# Patient Record
Sex: Female | Born: 1955 | Race: White | Hispanic: No | Smoking: Never smoker
Health system: Southern US, Community
[De-identification: ages and names within clinical notes are randomized; demographics above are authoritative.]

## PROBLEM LIST (undated history)

## (undated) ENCOUNTER — Emergency Department (HOSPITAL_COMMUNITY): Admission: EM | Payer: Self-pay | Source: Home / Self Care

---

## 2019-06-18 ENCOUNTER — Inpatient Hospital Stay (HOSPITAL_COMMUNITY)
Admission: AD | Admit: 2019-06-18 | Discharge: 2019-06-21 | DRG: 522 | Disposition: A | Discharge status: Home / Self Care | Payer: Medicare Other | Source: Other Acute Inpatient Hospital | Attending: Orthopaedic Surgery | Admitting: Orthopaedic Surgery

## 2019-06-18 ENCOUNTER — Emergency Department (HOSPITAL_COMMUNITY)
Admission: EM | Admit: 2019-06-18 | Discharge: 2019-06-18 | Discharge status: Absent without leave | Payer: Medicare Other | Source: Home / Self Care

## 2019-06-18 ENCOUNTER — Inpatient Hospital Stay (HOSPITAL_COMMUNITY)
Admission: EM | Admit: 2019-06-18 | Discharge: 2019-06-18 | Disposition: A | Discharge status: Still In Hospital | Payer: Medicare Other | Source: Other Acute Inpatient Hospital | Attending: Specialist | Admitting: Specialist

## 2019-06-18 ENCOUNTER — Emergency Department (HOSPITAL_COMMUNITY)
Admission: EM | Admit: 2019-06-18 | Discharge: 2019-06-18 | Discharge status: Absent without leave | Payer: Medicare Other | Source: Other Acute Inpatient Hospital

## 2019-06-18 DIAGNOSIS — Z419 Encounter for procedure for purposes other than remedying health state, unspecified: Secondary | ICD-10-CM

## 2019-06-18 DIAGNOSIS — Z96641 Presence of right artificial hip joint: Secondary | ICD-10-CM

## 2019-06-18 DIAGNOSIS — Z20828 Contact with and (suspected) exposure to other viral communicable diseases: Secondary | ICD-10-CM | POA: Diagnosis not present

## 2019-06-18 DIAGNOSIS — Z881 Allergy status to other antibiotic agents status: Secondary | ICD-10-CM | POA: Diagnosis not present

## 2019-06-18 DIAGNOSIS — W010XXA Fall on same level from slipping, tripping and stumbling without subsequent striking against object, initial encounter: Secondary | ICD-10-CM | POA: Diagnosis present

## 2019-06-18 DIAGNOSIS — Y93H2 Activity, gardening and landscaping: Secondary | ICD-10-CM

## 2019-06-18 DIAGNOSIS — S72041A Displaced fracture of base of neck of right femur, initial encounter for closed fracture: Secondary | ICD-10-CM | POA: Diagnosis not present

## 2019-06-18 DIAGNOSIS — Z79899 Other long term (current) drug therapy: Secondary | ICD-10-CM

## 2019-06-18 DIAGNOSIS — Y92017 Garden or yard in single-family (private) house as the place of occurrence of the external cause: Secondary | ICD-10-CM

## 2019-06-18 DIAGNOSIS — S72001A Fracture of unspecified part of neck of right femur, initial encounter for closed fracture: Principal | ICD-10-CM | POA: Diagnosis present

## 2019-06-18 DIAGNOSIS — Z886 Allergy status to analgesic agent status: Secondary | ICD-10-CM | POA: Diagnosis not present

## 2019-06-18 DIAGNOSIS — M25551 Pain in right hip: Secondary | ICD-10-CM | POA: Diagnosis present

## 2019-06-18 MED ORDER — HYDROCODONE-ACETAMINOPHEN 5-325 MG PO TABS
1.0000 | ORAL_TABLET | ORAL | Status: DC | PRN
  Administered 2019-06-18: 1 via ORAL
  Filled 2019-06-18: qty 1

## 2019-06-18 MED ORDER — DOCUSATE SODIUM 100 MG PO CAPS
100.0000 mg | ORAL_CAPSULE | Freq: Two times a day (BID) | ORAL | Status: DC
  Administered 2019-06-19 – 2019-06-21 (×4): 100 mg via ORAL
  Filled 2019-06-18 (×4): qty 1

## 2019-06-18 MED ORDER — HYDROCODONE-ACETAMINOPHEN 7.5-325 MG PO TABS: 1.0000 | ORAL_TABLET | ORAL | Status: DC | PRN

## 2019-06-18 MED ORDER — DIPHENHYDRAMINE HCL 12.5 MG/5ML PO ELIX: 12.5000 mg | ORAL_SOLUTION | ORAL | Status: DC | PRN

## 2019-06-18 MED ORDER — SODIUM CHLORIDE 0.9 % IV SOLN
INTRAVENOUS | Status: DC
  Administered 2019-06-19: 06:00:00 via INTRAVENOUS

## 2019-06-18 MED ORDER — MORPHINE SULFATE (PF) 2 MG/ML IV SOLN: 0.5000 mg | INTRAVENOUS | Status: DC | PRN

## 2019-06-18 MED ORDER — METHOCARBAMOL 1000 MG/10ML IJ SOLN
500.0000 mg | Freq: Four times a day (QID) | INTRAVENOUS | Status: DC | PRN
  Filled 2019-06-18: qty 5

## 2019-06-18 MED ORDER — METHOCARBAMOL 500 MG PO TABS
500.0000 mg | ORAL_TABLET | Freq: Four times a day (QID) | ORAL | Status: DC | PRN
  Administered 2019-06-19 – 2019-06-21 (×5): 500 mg via ORAL
  Filled 2019-06-18 (×5): qty 1

## 2019-06-18 MED ORDER — ACETAMINOPHEN 325 MG PO TABS
325.0000 mg | ORAL_TABLET | Freq: Four times a day (QID) | ORAL | Status: DC | PRN
  Administered 2019-06-19: 325 mg via ORAL
  Administered 2019-06-20 – 2019-06-21 (×4): 650 mg via ORAL
  Filled 2019-06-18 (×5): qty 2

## 2019-06-18 NOTE — H&P (Signed)
[facility] NOTE REDACTION - DIFFERENT PATIENT  Due to an Incorrect Registration or Identity Theft, we are redacting this note. In these particular instances; both names are NOT the same patient.  We have changed the demographic information to reflect those of the correct patient.  Please review for accuracy and ? Mark Complt the note.  Please do not refresh the body of the note.   Should the change in demographics impact clinical decisions that had been made at the time; You may choose to Edit Note from our Note Redaction.  This will create an Addendum      Deborah Kelly On[employer] Identity Application Analyst [phone] [email or web address] 

## 2019-06-18 NOTE — H&P (Signed)
Haley Marshall is an 63 y.o. female.   Chief Complaint: Right hip pain with known hip fracture HPI: The patient is a healthy 63 year old female from [place] who was down in [place] visiting her daughter.  Yesterday in the late afternoon she was doing yard work with her daughter when she fell landing directly on her right hip.  She had severe right hip pain and the inability to ambulate.  She was seen and admitted to an outside hospital with displaced right hip femoral neck fracture.  She reports only right hip pain.  She did request transfer to a closer hospital given the need for surgery and the recommendation for a total hip arthroplasty.  She has no previous hip pain and does not walk with assistive device.  She is a very active individual and does wish to proceed with a total hip arthroplasty to treat this injury.  She has no acute medical issues.  She does take Plaquenil to treat lupus.  She is not a diabetic and not a smoker.  She had a COVID-19 rapid test done this morning at the outlying hospital which was negative for coronavirus.  She reports only right hip pain and denies any other injuries.  No past medical history on file.  There is no past surgical history.  No family history on file. Social History:  has no history on file for tobacco, alcohol, and drug.  Allergies:  Allergies  Allergen Reactions  . Tetracyclines & Related Nausea And Vomiting  . Celebrex [Celecoxib] Rash    Medications Prior to Admission  Medication Sig Dispense Refill  . Cholecalciferol (VITAMIN D3) 125 MCG (5000 UT) CAPS Take 1 capsule by mouth daily.    . hydroxychloroquine (PLAQUENIL) 200 MG tablet Take 200 mg by mouth daily.    Marland Kitchen OVER THE COUNTER MEDICATION Take 1 capsule by mouth daily. Vitamin C Complex      No results found for this or any previous visit (from the past 48 hour(s)). No results found.  Review of Systems  Musculoskeletal: Positive for joint pain.  All  other systems reviewed and are negative.   Blood pressure 118/60, pulse 82, temperature 98.3 F (36.8 C), temperature source Oral, resp. rate 17, height 5\' 9"  (1.753 m), weight 68.9 kg, SpO2 97 %. Physical Exam  Constitutional: She is oriented to person, place, and time. She appears well-developed and well-nourished.  HENT:  Head: Normocephalic and atraumatic.  Eyes: Pupils are equal, round, and reactive to light. EOM are normal.  Neck: Normal range of motion. Neck supple.  Cardiovascular: Normal rate and regular rhythm.  Respiratory: Effort normal and breath sounds normal.  GI: Soft. Bowel sounds are normal.  Musculoskeletal:     Right hip: She exhibits decreased range of motion, decreased strength, tenderness, bony tenderness and deformity.  Neurological: She is alert and oriented to person, place, and time.  Skin: Skin is warm and dry.  Psychiatric: She has a normal mood and affect.    Her right leg is shortened and externally rotated consistent with a right hip fracture.  Her foot is well-perfused with normal sensation.  She is able to move her foot and toes.  Assessment/Plan Displaced right hip femoral neck fracture  She understands that our recommendation is a total hip arthroplasty.  She is a young, active, and healthy female with displaced femoral neck fracture.  She understands our recommendation fully for surgery.  I discussed in detail what the surgery involves.  We talked about her  interoperative and postoperative course.  I discussed in detail the risk and benefits of surgery as well.  All question concerns were answered and addressed.  Our plan will be to proceed to surgery late tomorrow afternoon (Monday) for a right total hip arthroplasty through direct anterior approach.  Kathryne Hitch, MD 06/18/2019, 11:06 PM

## 2019-06-19 ENCOUNTER — Encounter: Admission: EM | Payer: Self-pay | Source: Other Acute Inpatient Hospital

## 2019-06-19 ENCOUNTER — Inpatient Hospital Stay (HOSPITAL_COMMUNITY): Payer: Medicare Other | Admitting: Certified Registered"

## 2019-06-19 ENCOUNTER — Encounter (HOSPITAL_COMMUNITY)
Admission: AD | Disposition: A | Discharge status: Home / Self Care | Payer: Self-pay | Source: Other Acute Inpatient Hospital | Attending: Orthopaedic Surgery

## 2019-06-19 ENCOUNTER — Inpatient Hospital Stay (HOSPITAL_COMMUNITY): Payer: Medicare Other

## 2019-06-19 ENCOUNTER — Other Ambulatory Visit: Payer: Self-pay

## 2019-06-19 ENCOUNTER — Inpatient Hospital Stay
Admission: EM | Admit: 2019-06-19 | Payer: Self-pay | Source: Other Acute Inpatient Hospital | Admitting: Orthopaedic Surgery

## 2019-06-19 ENCOUNTER — Encounter (HOSPITAL_COMMUNITY): Payer: Self-pay | Admitting: *Deleted

## 2019-06-19 DIAGNOSIS — S72001A Fracture of unspecified part of neck of right femur, initial encounter for closed fracture: Secondary | ICD-10-CM | POA: Diagnosis not present

## 2019-06-19 DIAGNOSIS — S72041A Displaced fracture of base of neck of right femur, initial encounter for closed fracture: Secondary | ICD-10-CM

## 2019-06-19 HISTORY — PX: TOTAL HIP ARTHROPLASTY: SHX124

## 2019-06-19 LAB — TYPE AND SCREEN
ABO/RH(D): O POS
Antibody Screen: NEGATIVE

## 2019-06-19 LAB — BASIC METABOLIC PANEL
Anion gap: 10 (ref 5–15)
BUN: 20 mg/dL (ref 8–23)
CO2: 23 mmol/L (ref 22–32)
Calcium: 8.5 mg/dL — ABNORMAL LOW (ref 8.9–10.3)
Chloride: 108 mmol/L (ref 98–111)
Creatinine, Ser: 0.6 mg/dL (ref 0.44–1.00)
GFR calc Af Amer: >60 mL/min (ref >60)
GFR calc non Af Amer: >60 mL/min (ref >60)
Glucose, Bld: 114 mg/dL — ABNORMAL HIGH (ref 70–99)
Potassium: 3.8 mmol/L (ref 3.5–5.1)
Sodium: 141 mmol/L (ref 135–145)

## 2019-06-19 LAB — CBC
HCT: 38.6 % (ref 36.0–46.0)
Hemoglobin: 12.5 g/dL (ref 12.0–15.0)
MCH: 30 pg (ref 26.0–34.0)
MCHC: 32.4 g/dL (ref 30.0–36.0)
MCV: 92.6 fL (ref 80.0–100.0)
Platelets: 165 10*3/uL (ref 150–400)
RBC: 4.17 MIL/uL (ref 3.87–5.11)
RDW: 12.7 % (ref 11.5–15.5)
WBC: 4.7 10*3/uL (ref 4.0–10.5)
nRBC: 0 % (ref 0.0–0.2)

## 2019-06-19 LAB — MRSA PCR SCREENING: MRSA by PCR: NEGATIVE

## 2019-06-19 LAB — SARS CORONAVIRUS 2 BY RT PCR (HOSPITAL ORDER, PERFORMED IN ~~LOC~~ HOSPITAL LAB): SARS Coronavirus 2: POSITIVE — AB

## 2019-06-19 LAB — PROTIME-INR
INR: 1.1 (ref 0.8–1.2)
Prothrombin Time: 14 seconds (ref 11.4–15.2)

## 2019-06-19 LAB — ABO/RH: ABO/RH(D): O POS

## 2019-06-19 LAB — SARS CORONAVIRUS 2 (TAT 6-24 HRS): SARS Coronavirus 2: NEGATIVE

## 2019-06-19 SURGERY — ARTHROPLASTY, HIP, TOTAL, ANTERIOR APPROACH
Anesthesia: General | Site: Hip | Laterality: Right

## 2019-06-19 SURGERY — ARTHROPLASTY, HIP, TOTAL, ANTERIOR APPROACH
Anesthesia: Monitor Anesthesia Care | Site: Hip | Laterality: Right

## 2019-06-19 MED ORDER — FENTANYL CITRATE (PF) 250 MCG/5ML IJ SOLN
INTRAMUSCULAR | Status: AC
  Filled 2019-06-19: qty 5

## 2019-06-19 MED ORDER — ACETAMINOPHEN 325 MG PO TABS: 325.0000 mg | ORAL_TABLET | Freq: Four times a day (QID) | ORAL | Status: DC | PRN

## 2019-06-19 MED ORDER — ONDANSETRON HCL 4 MG/2ML IJ SOLN: 4.0000 mg | Freq: Four times a day (QID) | INTRAMUSCULAR | Status: DC | PRN

## 2019-06-19 MED ORDER — OXYCODONE HCL 5 MG PO TABS: 5.0000 mg | ORAL_TABLET | Freq: Once | ORAL | Status: DC | PRN

## 2019-06-19 MED ORDER — FENTANYL CITRATE (PF) 100 MCG/2ML IJ SOLN
INTRAMUSCULAR | Status: AC
  Filled 2019-06-19: qty 2

## 2019-06-19 MED ORDER — ASPIRIN 81 MG PO CHEW
81.0000 mg | CHEWABLE_TABLET | Freq: Two times a day (BID) | ORAL | Status: DC
  Administered 2019-06-20 – 2019-06-21 (×3): 81 mg via ORAL
  Filled 2019-06-19 (×3): qty 1

## 2019-06-19 MED ORDER — OXYCODONE HCL 5 MG PO TABS: 5.0000 mg | ORAL_TABLET | ORAL | Status: DC | PRN

## 2019-06-19 MED ORDER — DOCUSATE SODIUM 100 MG PO CAPS: 100.0000 mg | ORAL_CAPSULE | Freq: Two times a day (BID) | ORAL | Status: DC

## 2019-06-19 MED ORDER — METHOCARBAMOL 500 MG PO TABS: 500.0000 mg | ORAL_TABLET | Freq: Four times a day (QID) | ORAL | Status: DC | PRN

## 2019-06-19 MED ORDER — HYDROMORPHONE HCL 1 MG/ML IJ SOLN: 0.5000 mg | INTRAMUSCULAR | Status: DC | PRN

## 2019-06-19 MED ORDER — HYDROCODONE-ACETAMINOPHEN 5-325 MG PO TABS: 1.0000 | ORAL_TABLET | ORAL | Status: DC | PRN

## 2019-06-19 MED ORDER — ONDANSETRON HCL 4 MG/2ML IJ SOLN
INTRAMUSCULAR | Status: DC | PRN
  Administered 2019-06-19: 4 mg via INTRAVENOUS

## 2019-06-19 MED ORDER — PANTOPRAZOLE SODIUM 40 MG PO TBEC
40.0000 mg | DELAYED_RELEASE_TABLET | Freq: Every day | ORAL | Status: DC
  Administered 2019-06-20 – 2019-06-21 (×2): 40 mg via ORAL
  Filled 2019-06-19 (×3): qty 1

## 2019-06-19 MED ORDER — MENTHOL 3 MG MT LOZG: 1.0000 | LOZENGE | OROMUCOSAL | Status: DC | PRN

## 2019-06-19 MED ORDER — PROPOFOL 10 MG/ML IV BOLUS
INTRAVENOUS | Status: DC | PRN
  Administered 2019-06-19: 30 mg via INTRAVENOUS
  Administered 2019-06-19: 20 mg via INTRAVENOUS

## 2019-06-19 MED ORDER — METHOCARBAMOL 1000 MG/10ML IJ SOLN: 500.0000 mg | Freq: Four times a day (QID) | INTRAVENOUS | Status: DC | PRN

## 2019-06-19 MED ORDER — METOCLOPRAMIDE HCL 10 MG PO TABS: 5.0000 mg | ORAL_TABLET | Freq: Three times a day (TID) | ORAL | Status: DC | PRN

## 2019-06-19 MED ORDER — MIDAZOLAM HCL 5 MG/5ML IJ SOLN
INTRAMUSCULAR | Status: DC | PRN
  Administered 2019-06-19: 2 mg via INTRAVENOUS

## 2019-06-19 MED ORDER — ONDANSETRON HCL 4 MG PO TABS: 4.0000 mg | ORAL_TABLET | Freq: Four times a day (QID) | ORAL | Status: DC | PRN

## 2019-06-19 MED ORDER — FENTANYL CITRATE (PF) 100 MCG/2ML IJ SOLN: 25.0000 ug | INTRAMUSCULAR | Status: DC | PRN

## 2019-06-19 MED ORDER — HYDROCODONE-ACETAMINOPHEN 7.5-325 MG PO TABS
1.0000 | ORAL_TABLET | ORAL | Status: DC | PRN
  Administered 2019-06-19: 2 via ORAL
  Filled 2019-06-19: qty 2

## 2019-06-19 MED ORDER — 0.9 % SODIUM CHLORIDE (POUR BTL) OPTIME
TOPICAL | Status: DC | PRN
  Administered 2019-06-19: 1000 mL

## 2019-06-19 MED ORDER — OXYCODONE HCL 5 MG PO TABS
ORAL_TABLET | ORAL | Status: AC
  Filled 2019-06-19: qty 1

## 2019-06-19 MED ORDER — VITAMIN D 25 MCG (1000 UNIT) PO TABS
5000.0000 [IU] | ORAL_TABLET | Freq: Every day | ORAL | Status: DC
  Administered 2019-06-20 – 2019-06-21 (×2): 5000 [IU] via ORAL
  Filled 2019-06-19 (×2): qty 5

## 2019-06-19 MED ORDER — OXYCODONE HCL 5 MG PO TABS: 10.0000 mg | ORAL_TABLET | ORAL | Status: DC | PRN

## 2019-06-19 MED ORDER — FENTANYL CITRATE (PF) 100 MCG/2ML IJ SOLN
INTRAMUSCULAR | Status: DC | PRN
  Administered 2019-06-19: 100 ug via INTRAVENOUS

## 2019-06-19 MED ORDER — ONDANSETRON HCL 4 MG/2ML IJ SOLN
INTRAMUSCULAR | Status: AC
  Filled 2019-06-19: qty 2

## 2019-06-19 MED ORDER — CHLORHEXIDINE GLUCONATE CLOTH 2 % EX PADS
6.0000 | MEDICATED_PAD | Freq: Every day | CUTANEOUS | Status: DC
  Administered 2019-06-19: 6 via TOPICAL

## 2019-06-19 MED ORDER — PHENOL 1.4 % MT LIQD: 1.0000 | OROMUCOSAL | Status: DC | PRN

## 2019-06-19 MED ORDER — DIPHENHYDRAMINE HCL 12.5 MG/5ML PO ELIX: 12.5000 mg | ORAL_SOLUTION | ORAL | Status: DC | PRN

## 2019-06-19 MED ORDER — SODIUM CHLORIDE 0.9 % IV SOLN
INTRAVENOUS | Status: DC
  Administered 2019-06-19: 23:00:00 via INTRAVENOUS

## 2019-06-19 MED ORDER — METOCLOPRAMIDE HCL 5 MG/ML IJ SOLN: 5.0000 mg | Freq: Three times a day (TID) | INTRAMUSCULAR | Status: DC | PRN

## 2019-06-19 MED ORDER — CEFAZOLIN SODIUM-DEXTROSE 2-3 GM-%(50ML) IV SOLR
INTRAVENOUS | Status: DC | PRN
  Administered 2019-06-19: 2 g via INTRAVENOUS

## 2019-06-19 MED ORDER — OXYCODONE HCL 5 MG/5ML PO SOLN: 5.0000 mg | Freq: Once | ORAL | Status: DC | PRN

## 2019-06-19 MED ORDER — ALUM & MAG HYDROXIDE-SIMETH 200-200-20 MG/5ML PO SUSP: 30.0000 mL | ORAL | Status: DC | PRN

## 2019-06-19 MED ORDER — PHENYLEPHRINE HCL-NACL 10-0.9 MG/250ML-% IV SOLN
INTRAVENOUS | Status: DC | PRN
  Administered 2019-06-19: 40 ug/min via INTRAVENOUS

## 2019-06-19 MED ORDER — ONDANSETRON HCL 4 MG/2ML IJ SOLN: 4.0000 mg | Freq: Once | INTRAMUSCULAR | Status: DC | PRN

## 2019-06-19 MED ORDER — CEFAZOLIN SODIUM-DEXTROSE 1-4 GM/50ML-% IV SOLN
1.0000 g | Freq: Four times a day (QID) | INTRAVENOUS | Status: AC
  Administered 2019-06-19 – 2019-06-20 (×2): 1 g via INTRAVENOUS
  Filled 2019-06-19 (×2): qty 50

## 2019-06-19 MED ORDER — DEXAMETHASONE SODIUM PHOSPHATE 10 MG/ML IJ SOLN
INTRAMUSCULAR | Status: DC | PRN
  Administered 2019-06-19: 10 mg via INTRAVENOUS

## 2019-06-19 MED ORDER — HYDROXYCHLOROQUINE SULFATE 200 MG PO TABS
200.0000 mg | ORAL_TABLET | Freq: Every day | ORAL | Status: DC
  Administered 2019-06-20 – 2019-06-21 (×2): 200 mg via ORAL
  Filled 2019-06-19 (×2): qty 1

## 2019-06-19 MED ORDER — PROPOFOL 500 MG/50ML IV EMUL
INTRAVENOUS | Status: DC | PRN
  Administered 2019-06-19: 100 ug/kg/min via INTRAVENOUS

## 2019-06-19 MED ORDER — LACTATED RINGERS IV SOLN
INTRAVENOUS | Status: DC | PRN
  Administered 2019-06-19: 18:00:00 via INTRAVENOUS

## 2019-06-19 MED ORDER — MIDAZOLAM HCL 2 MG/2ML IJ SOLN
INTRAMUSCULAR | Status: AC
  Filled 2019-06-19: qty 2

## 2019-06-19 MED ORDER — POLYETHYLENE GLYCOL 3350 17 G PO PACK: 17.0000 g | PACK | Freq: Every day | ORAL | Status: DC | PRN

## 2019-06-19 SURGICAL SUPPLY — 57 items
BENZOIN TINCTURE PRP APPL 2/3 (GAUZE/BANDAGES/DRESSINGS) ×3 IMPLANT
BLADE CLIPPER SURG (BLADE) IMPLANT
BLADE SAW SGTL 18X1.27X75 (BLADE) ×4 IMPLANT
BLADE SAW SGTL 18X1.27X75MM (BLADE) ×2
CLOSURE STERI-STRIP 1/2X4 (GAUZE/BANDAGES/DRESSINGS) ×1
CLOSURE WOUND 1/2 X4 (GAUZE/BANDAGES/DRESSINGS) ×2
CLSR STERI-STRIP ANTIMIC 1/2X4 (GAUZE/BANDAGES/DRESSINGS) ×2 IMPLANT
COVER SURGICAL LIGHT HANDLE (MISCELLANEOUS) ×3 IMPLANT
COVER WAND RF STERILE (DRAPES) ×3 IMPLANT
CUP SECTOR GRIPTON 50MM (Cup) ×3 IMPLANT
DRAPE C-ARM 42X72 X-RAY (DRAPES) ×3 IMPLANT
DRAPE STERI IOBAN 125X83 (DRAPES) ×3 IMPLANT
DRAPE U-SHAPE 47X51 STRL (DRAPES) ×9 IMPLANT
DRSG AQUACEL AG ADV 3.5X10 (GAUZE/BANDAGES/DRESSINGS) ×3 IMPLANT
DURAPREP 26ML APPLICATOR (WOUND CARE) ×3 IMPLANT
ELECT BLADE 4.0 EZ CLEAN MEGAD (MISCELLANEOUS) ×3
ELECT BLADE 6.5 EXT (BLADE) IMPLANT
ELECT REM PT RETURN 9FT ADLT (ELECTROSURGICAL) ×3
ELECTRODE BLDE 4.0 EZ CLN MEGD (MISCELLANEOUS) ×1 IMPLANT
ELECTRODE REM PT RTRN 9FT ADLT (ELECTROSURGICAL) ×1 IMPLANT
FACESHIELD WRAPAROUND (MASK) ×6 IMPLANT
GLOVE BIOGEL PI IND STRL 8 (GLOVE) ×2 IMPLANT
GLOVE BIOGEL PI INDICATOR 8 (GLOVE) ×4
GLOVE ECLIPSE 8.0 STRL XLNG CF (GLOVE) ×3 IMPLANT
GLOVE ORTHO TXT STRL SZ7.5 (GLOVE) ×6 IMPLANT
GOWN STRL REUS W/ TWL LRG LVL3 (GOWN DISPOSABLE) ×2 IMPLANT
GOWN STRL REUS W/ TWL XL LVL3 (GOWN DISPOSABLE) ×2 IMPLANT
GOWN STRL REUS W/TWL LRG LVL3 (GOWN DISPOSABLE) ×4
GOWN STRL REUS W/TWL XL LVL3 (GOWN DISPOSABLE) ×4
HANDPIECE INTERPULSE COAX TIP (DISPOSABLE) ×2
HEAD FEMORAL 32 CERAMIC (Hips) ×3 IMPLANT
KIT BASIN OR (CUSTOM PROCEDURE TRAY) ×3 IMPLANT
KIT TURNOVER KIT B (KITS) ×3 IMPLANT
LINER ACETABULAR 32X50 (Liner) ×3 IMPLANT
MANIFOLD NEPTUNE II (INSTRUMENTS) ×3 IMPLANT
NS IRRIG 1000ML POUR BTL (IV SOLUTION) ×3 IMPLANT
PACK TOTAL JOINT (CUSTOM PROCEDURE TRAY) ×3 IMPLANT
PAD ARMBOARD 7.5X6 YLW CONV (MISCELLANEOUS) ×3 IMPLANT
SCREW 6.5MMX25MM (Screw) ×3 IMPLANT
SET HNDPC FAN SPRY TIP SCT (DISPOSABLE) ×1 IMPLANT
STAPLER VISISTAT 35W (STAPLE) IMPLANT
STEM FEM ACTIS STD SZ4 (Stem) ×3 IMPLANT
STRIP CLOSURE SKIN 1/2X4 (GAUZE/BANDAGES/DRESSINGS) ×4 IMPLANT
SUT ETHIBOND NAB CT1 #1 30IN (SUTURE) ×6 IMPLANT
SUT MNCRL AB 4-0 PS2 18 (SUTURE) IMPLANT
SUT VIC AB 0 CT1 27 (SUTURE) ×2
SUT VIC AB 0 CT1 27XBRD ANBCTR (SUTURE) ×1 IMPLANT
SUT VIC AB 1 CT1 27 (SUTURE) ×4
SUT VIC AB 1 CT1 27XBRD ANBCTR (SUTURE) ×2 IMPLANT
SUT VIC AB 2-0 CT1 27 (SUTURE) ×4
SUT VIC AB 2-0 CT1 TAPERPNT 27 (SUTURE) ×2 IMPLANT
TOWEL GREEN STERILE (TOWEL DISPOSABLE) ×3 IMPLANT
TOWEL GREEN STERILE FF (TOWEL DISPOSABLE) ×3 IMPLANT
TRAY CATH 16FR W/PLASTIC CATH (SET/KITS/TRAYS/PACK) IMPLANT
TRAY FOLEY W/BAG SLVR 16FR (SET/KITS/TRAYS/PACK)
TRAY FOLEY W/BAG SLVR 16FR ST (SET/KITS/TRAYS/PACK) IMPLANT
WATER STERILE IRR 1000ML POUR (IV SOLUTION) ×6 IMPLANT

## 2019-06-19 NOTE — Op Note (Signed)
NAME: Haley Marshall, Haley Marshall MEDICAL RECORD CB:[id number] ACCOUNT [id number] DATE OF BIRTH:[birth year] FACILITY: [facility] LOCATION: [facility] PHYSICIAN:Shantasia Hunnell [place], MD  OPERATIVE REPORT  DATE OF PROCEDURE:  06/19/2019  PREOPERATIVE DIAGNOSIS:  Right hip displaced femoral neck fracture.  POSTOPERATIVE DIAGNOSIS:  Right hip displaced femoral neck fracture.  PROCEDURE:  Right total hip arthroplasty through direct anterior approach.  IMPLANTS:  DePuy Sector Gription acetabular component size 50, with a single screw, size 32+0 neutral polyethylene liner, size 4 Actis femoral component with standard offset, size 32+1 ceramic hip ball.  SURGEON:  Jonn Shingles, MD  ASSISTANT:  Erskine Emery, PA-C  ANESTHESIA:  Spinal.  ANTIBIOTICS:  Two g IV Ancef.  ESTIMATED BLOOD LOSS:  100 mL.  COMPLICATIONS:  None.  INDICATIONS:  The patient is a very pleasant 63 year old healthy female who was in her daughter's yard near [place], [place] when they were doing some yard work and she sustained an accidental mechanical fall, landing on her right hip.  She had  the inability to ambulate and severe right hip pain afterwards.  She had no history of hip problems before this.  She was seen in an outlying hospital and admitted with a femoral neck fracture.  She then requested transfer to our health system given the  fact that she lives in [place] and she wanted to have definitive treatment and followup here.  Also, the surgeon on call down in [place] does not do a lot of hip surgery and recommended a partial hip replacement.  We felt that a total hip  replacement would be better given the fact that she is only 63 years old.  We told her about anterior hip surgery.  She was then transferred to [facility] uneventfully.  She has been asymptomatic other than right hip pain.  She had no chest pain,  shortness of breath, fever, chills, nausea, vomiting.  A rapid COVID-19 test was performed at  the outside hospital, which was negative.  We still repeated the test here and it came back positive, so she was then transferred to the COVID 4 here at [facility].  I requested a second rapid test and so she had that done today and that came back negative, but she still had to be treated for presumptive positive even though she is asymptomatic.  That certainly does not make sense given the fact that she has  now had 2 negative COVID tests sandwiched on either side of a positive test and she is asymptomatic, but with that being said, we are still going to have to treat her as COVID positive for infection prevention.  She understands fully well.  We  recommended total hip arthroplasty to treat her displaced right hip femoral neck fracture.  I had a long and thorough discussion about the risk of acute blood loss anemia, nerve or vessel injury, fracture, infection, dislocation, DVT and implant failure.   We talked about our goals being decreased pain, improve mobility and overall improve quality of life.  DESCRIPTION OF PROCEDURE:  After informed consent was obtained, the appropriate right hip was marked.  She was brought to the operating room and laid on her side on the stretcher where spinal anesthesia was obtained.  A Foley catheter had already been  placed.  We placed traction boots on both her feet and placed her supine on the Hana fracture table, with the peroneal post in place and both legs in line skeletal traction device and no traction applied.  Her right operative hip  was prepped and draped  with DuraPrep and sterile drapes.  We assessed it radiographically as well.  A timeout was called to identify correct patient and correct right hip.  We then made an incision just inferior and posterior to the anterior superior iliac spine and carried  this obliquely down the leg.  We dissected down to tensor fascia lata muscle.  Tensor fascia was then divided longitudinally to proceed with direct anterior  approach to the hip.  We identified and cauterized circumflex vessels.  We then identified the  hip capsule, opened up the hip capsule in an L-type format, finding a large hemarthrosis consistent with a femoral neck fracture.  You could see the femoral neck was completely broken through and the femoral head was displaced.  We then placed Cobra  retractors around the medial and lateral femoral neck and made our femoral neck cut just distal to the fracture and proximal to the lesser trochanter.  We did this with an oscillating saw and completed it with an osteotome.  We placed a corkscrew guide  in the femoral head and removed the femoral head in its entirety and found no disease with the hip in terms of arthritis.  We then placed a bent Hohmann over the medial acetabular rim and removed remnants of the acetabular labrum and other debris from  the hip.  I then began reaming under direct visualization from a size 44 reamer in stepwise increments up to a size 49, with all reamers placed under direct visualization, the last reamer under direct fluoroscopy so we could obtain our depth of reaming,  our inclination and anteversion.  We then placed the real DePuy Sector Gription acetabular component size 32 and a single screw.  I placed a 32+0 neutral polyethylene liner for that size acetabular component.  Attention was then turned to the femur.   With the leg externally rotated to 120 degrees, extended and adducted, we were able to place a Mueller retractor medially and Hohman retractor behind the greater trochanter.  We did release the lateral joint capsule and used a box-cutting osteotome to  enter femoral canal with a rongeur to lateralize.  We then began broaching using the Actis broaching system from a size zero, going up to a size 4.  With a size 4 in place, we trialed a standard offset femoral neck and a 32+1 trial hip ball, reduced this  in the acetabulum.  We were pleased with range of motion, leg length,  offset and stability assessed radiographically and clinically.  We then dislocated the hip and removed the trial components.  We placed the real Actis femoral component size 4 with  standard offset and the real 32+1 ceramic hip ball, again reduced this in the acetabulum and we were pleased with stability.  We then irrigated the soft tissue with normal saline solution using pulsatile lavage.  I assessed the hip again radiographically  just to make sure we were pleased with the  component position.  We then closed the joint capsule with interrupted #1 Ethibond suture.  The tensor fascia was closed with #1 Vicryl suture, 0 Vicryl was used to close the deep tissue, 2-0 Vicryl was used  to close the subcutaneous tissue and a 4-0 Monocryl subcuticular stitch was applied.  Steri-Strips were placed on the skin.  An Aquacel dressing was applied.  She was taken to the recovery room in stable condition.  All final counts were correct.  There  were no complications noted.  Of note,  Rexene EdisonGil Clark, PA-C, assisted in the entire case.  His assistance was crucial for facilitating all aspects of this case.  VN/NUANCE  D:06/19/2019 T:06/19/2019 JOB:[id number]

## 2019-06-19 NOTE — Progress Notes (Addendum)
Discussed patient's case with infection prevention. Discussed that patient had a negative covid test at an outside facility, followed by a positive test on [facility]. Patient was re-tested this morning and test came back negative. Per IP, continue isolation precautions and continue with precautions in OR this afternoon.

## 2019-06-19 NOTE — Progress Notes (Signed)
Patient ID: Haley Marshall, female   DOB: Dec 29, 1955, 63 y.o.   MRN: [id number] The patient is scheduled today for a right total hip arthroplasty to treat her acute femoral neck fracture of the right hip.  Surgery will be this evening.  Her injury occurred at 4 PM Saturday late afternoon while working in the yard with her daughter.  She landed directly on that hip.  She has had no cough, chest pain, shortness of breath, fever, chills, nausea, vomiting.  She was admitted to a hospital in [facility] and then transferred here to [facility] yesterday.  A rapid Covid test done yesterday morning at the outlying hospital was negative.  I did order another test at our hospital yesterday evening just in case that test was not allowed.  It actually came back positive.  She was transferred to [address].  She continues to be asymptomatic with stable vital signs.  Our plan will still be to proceed to surgery this evening for her hip replacement.  Hopefully this can be done under spinal anesthesia.  She does wish to have the surgery done and it is medically warranted given now she is 48 hours into a broken hip.  Given the fact that she had a negative COVID-19 test yesterday followed by later a positive test, we will repeat that test 1 more time today.  Regardless, our plan will be to proceed to surgery tonight.  We will likely need to treat her as Covid positive and perform surgery under the protocols for patients that are positive.

## 2019-06-19 NOTE — Anesthesia Preprocedure Evaluation (Signed)
Anesthesia Evaluation  Patient identified by MRN, date of birth, ID band Patient awake    Reviewed: Allergy & Precautions, NPO status , Patient's Chart, lab work & pertinent test results  Airway Mallampati: II  TM Distance: >3 FB Neck ROM: Full    Dental  (+) Teeth Intact, Dental Advisory Given   Pulmonary    breath sounds clear to auscultation       Cardiovascular  Rhythm:Regular Rate:Normal     Neuro/Psych    GI/Hepatic   Endo/Other    Renal/GU      Musculoskeletal   Abdominal   Peds  Hematology   Anesthesia Other Findings   Reproductive/Obstetrics                             Anesthesia Physical Anesthesia Plan  ASA: III  Anesthesia Plan: Spinal and MAC   Post-op Pain Management:    Induction:   PONV Risk Score and Plan: Ondansetron and Dexamethasone  Airway Management Planned: Natural Airway and Simple Face Mask  Additional Equipment:   Intra-op Plan:   Post-operative Plan:   Informed Consent: I have reviewed the patients History and Physical, chart, labs and discussed the procedure including the risks, benefits and alternatives for the proposed anesthesia with the patient or authorized representative who has indicated his/her understanding and acceptance.     Dental advisory given  Plan Discussed with: CRNA and Anesthesiologist  Anesthesia Plan Comments:         Anesthesia Quick Evaluation

## 2019-06-19 NOTE — Progress Notes (Signed)
Patient transferred to [id number] with belongings.

## 2019-06-19 NOTE — Progress Notes (Signed)
Patient ID: Haley Marshall, female   DOB: 1956/02/13, 63 y.o.   MRN: [id number] The patient tolerated her right total hip arthroplasty surgery well.  Again this was to treat an acute displaced right hip femoral neck fracture.  My main concern is that she is on [facility] [address] on the COVID-19 floor.  I understand the rationale by putting her there first.  However I am uncertain as to the validity of her COVID-19 test.  She did have one of the highly specific COVID-19 test at the outside hospital that came back negative yesterday morning.  My concern was that our hospital would not honor that test because I have seen that happen before when we have tried to take someone to the operating room and the operating room's concern is that the test was not done here even though it was just completed yesterday.  I then ordered a rapid Covid test done here yesterday evening and this came back positive.  The patient has been completely asymptomatic.  Without being said I ordered another rapid COVID-19 test today and that came back negative.  According to infection prevention, she still needs to be potentially treated as Covid positive in light of a positive test sandwiched between 2 valid negative tests.  That is my concern.  Hopefully we will be able to still get the appropriate care for this patient from orthopedic standpoint on the [address] floor in terms of the therapy needs and for getting her discharged safely to home in the next 1 to 2 days.

## 2019-06-19 NOTE — Progress Notes (Signed)
Pt was transported to OR.  Debbi is alert and oriented.

## 2019-06-19 NOTE — Plan of Care (Signed)
  Problem: Coping: Goal: Level of anxiety will decrease Outcome: Progressing   Problem: Pain Managment: Goal: General experience of comfort will improve Outcome: Progressing   Problem: Safety: Goal: Ability to remain free from injury will improve Outcome: Progressing   Problem: Skin Integrity: Goal: Risk for impaired skin integrity will decrease Outcome: Progressing   

## 2019-06-19 NOTE — Progress Notes (Signed)
Informed Dr. Lorin Mercy of patient positive result of CoVid swab test by RT PCR. Obtained an order to transfer patient to [facility].

## 2019-06-19 NOTE — Transfer of Care (Signed)
Immediate Anesthesia Transfer of Care Note  Patient: Haley Marshall  Procedure(s) Performed: TOTAL HIP ARTHROPLASTY ANTERIOR APPROACH (Right Hip)  Patient Location: PACU and PACU RN to OR  Anesthesia Type:MAC and Spinal  Level of Consciousness: awake, alert  and oriented  Airway & Oxygen Therapy: Patient Spontanous Breathing and Patient connected to face mask oxygen  Post-op Assessment: Report given to RN and Post -op Vital signs reviewed and stable  Post vital signs: Reviewed and stable  Last Vitals:  Vitals Value Taken Time  BP    Temp    Pulse    Resp    SpO2      Last Pain:  Vitals:   06/19/19 1200  TempSrc: Oral  PainSc:          Complications: No apparent anesthesia complications

## 2019-06-19 NOTE — Anesthesia Procedure Notes (Signed)
Spinal  Patient location during procedure: OR Start time: 06/19/2019 5:35 PM End time: 06/19/2019 5:40 PM Staffing Anesthesiologist: Roberts Gaudy, MD Performed: anesthesiologist  Preanesthetic Checklist Completed: patient identified, site marked, surgical consent, pre-op evaluation, timeout performed, IV checked, risks and benefits discussed and monitors and equipment checked Spinal Block Patient position: right lateral decubitus Prep: DuraPrep Patient monitoring: heart rate, cardiac monitor, continuous pulse ox and blood pressure Approach: midline Location: L3-4 Injection technique: single-shot Needle Needle type: Sprotte and Tuohy  Needle gauge: 22 G Needle length: 9 cm Assessment Sensory level: T4 Additional Notes 1.6 cc 0.75% Bupivacaine injected

## 2019-06-19 NOTE — Brief Op Note (Signed)
06/19/2019  7:06 PM  PATIENT:  Haley Marshall  63 y.o. female  PRE-OPERATIVE DIAGNOSIS:  RIGHT HIP FRACTURE  POST-OPERATIVE DIAGNOSIS:  RIGHT HIP FRACTURE  PROCEDURE:  Procedure(s): TOTAL HIP ARTHROPLASTY ANTERIOR APPROACH (Right)  SURGEON:  Surgeon(s) and Role:    Mcarthur Rossetti, MD - Primary  PHYSICIAN ASSISTANT: Benita Stabile, PA-C  ANESTHESIA:   spinal  EBL: 100 cc  COUNTS:  YES  DICTATION: .Other Dictation: Dictation Number [id number]  PLAN OF CARE: Admit to inpatient   PATIENT DISPOSITION:  PACU - hemodynamically stable.   Delay start of Pharmacological VTE agent (>24hrs) due to surgical blood loss or risk of bleeding: no

## 2019-06-20 ENCOUNTER — Encounter (HOSPITAL_COMMUNITY): Payer: Self-pay | Admitting: Orthopaedic Surgery

## 2019-06-20 LAB — CBC
HCT: 34.3 % — ABNORMAL LOW (ref 36.0–46.0)
Hemoglobin: 11 g/dL — ABNORMAL LOW (ref 12.0–15.0)
MCH: 30.1 pg (ref 26.0–34.0)
MCHC: 32.1 g/dL (ref 30.0–36.0)
MCV: 93.7 fL (ref 80.0–100.0)
Platelets: 144 10*3/uL — ABNORMAL LOW (ref 150–400)
RBC: 3.66 MIL/uL — ABNORMAL LOW (ref 3.87–5.11)
RDW: 12.6 % (ref 11.5–15.5)
WBC: 4.9 10*3/uL (ref 4.0–10.5)
nRBC: 0 % (ref 0.0–0.2)

## 2019-06-20 LAB — BASIC METABOLIC PANEL
Anion gap: 12 (ref 5–15)
BUN: 16 mg/dL (ref 8–23)
CO2: 22 mmol/L (ref 22–32)
Calcium: 8.4 mg/dL — ABNORMAL LOW (ref 8.9–10.3)
Chloride: 106 mmol/L (ref 98–111)
Creatinine, Ser: 0.51 mg/dL (ref 0.44–1.00)
GFR calc Af Amer: >60 mL/min (ref >60)
GFR calc non Af Amer: >60 mL/min (ref >60)
Glucose, Bld: 111 mg/dL — ABNORMAL HIGH (ref 70–99)
Potassium: 4.1 mmol/L (ref 3.5–5.1)
Sodium: 140 mmol/L (ref 135–145)

## 2019-06-20 NOTE — Evaluation (Signed)
Occupational Therapy Evaluation Patient Details Name: Haley Marshall MRN: [id number] DOB: [birth year] Today's Date: 06/20/2019    History of Present Illness 63 yo female no sig nificant PMH experienced mechanical fall in daughter's yard and found to have displaced R femoral neck fx 12/05. Transferred to [facility] for R direct anterior total hip replacement by Dr Magnus Ivan. s/p sx 12/07.    Clinical Impression   This 63 y/o female presents with the above. PTA pt reports independence with ADL and functional mobility. Pt presents supine in bed pleasant and ready to participate with therapies. Pt demonstrating room level functional mobility using RW with overall minA. She currently requires up to [facility] for LB ADL, minA for toileting and standing grooming ADL. Pt overall tolerating session well with biggest limitation being RLE deficits given recent sx. Pt reports plans to return home with 24hr assist from daughter initially for ADL/iADL PRN. Pt will benefit from continued acute OT services to maximize her safety and independence with ADL and mobility. Do not anticipate pt will require follow up OT services. Will follow.   Follow Up Recommendations  No OT follow up;Supervision/Assistance - 24 hour(24hr initially)    Equipment Recommendations  None recommended by OT(pt reports able to borrow necessary equipment)           Precautions / Restrictions Precautions Precautions: Fall Precaution Comments: direct R THA Restrictions Weight Bearing Restrictions: Yes RLE Weight Bearing: Weight bearing as tolerated      Mobility Bed Mobility Overal bed mobility: Needs Assistance Bed Mobility: Supine to Sit     Supine to sit: Min guard;HOB elevated     General bed mobility comments: increased time and cues for technique, use of bedrail; minguard for safety  Transfers Overall transfer level: Needs assistance Equipment used: Rolling walker (2 wheeled) Transfers: Sit to/from Stand Sit to Stand:  Min assist;Min guard         General transfer comment: initially min A from bed, min guard from toilet and subsequently from bed    Balance Overall balance assessment: Needs assistance Sitting-balance support: Feet supported Sitting balance-Leahy Scale: Good     Standing balance support: Bilateral upper extremity supported;No upper extremity supported;During functional activity;Single extremity supported Standing balance-Leahy Scale: Fair                             ADL either performed or assessed with clinical judgement   ADL Overall ADL's : Needs assistance/impaired Eating/Feeding: Modified independent;Sitting   Grooming: Wash/dry hands;Min guard;Minimal assistance;Standing   Upper Body Bathing: Set up;Sitting   Lower Body Bathing: Minimal assistance;Sit to/from stand   Upper Body Dressing : Set up;Sitting   Lower Body Dressing: Minimal assistance;Moderate assistance;Sit to/from stand Lower Body Dressing Details (indicate cue type and reason): requires assist to don R sock (pt attempted to perform on her own but not quite able to), minA for standing balance Toilet Transfer: Minimal assistance;Ambulation;RW;Grab bars   Toileting- Clothing Manipulation and Hygiene: Minimal assistance;Sit to/from stand;Sitting/lateral lean Toileting - Clothing Manipulation Details (indicate cue type and reason): pt performing pericare via lateral leans, some assist for gown management     Functional mobility during ADLs: Minimal assistance;Rolling walker                           Pertinent Vitals/Pain Pain Assessment: No/denies pain     Hand Dominance Right   Extremity/Trunk Assessment Upper Extremity Assessment Upper Extremity Assessment: Overall  WFL for tasks assessed   Lower Extremity Assessment Lower Extremity Assessment: Defer to PT evaluation RLE Deficits / Details: R hip and knee ROM limited by stiffness and swelling, strength grossly assessed from  mobility to be 4/5 RLE: Unable to fully assess due to pain RLE Sensation: decreased light touch(post nerve block "different" feeling ) RLE Coordination: decreased fine motor       Communication Communication Communication: No difficulties   Cognition Arousal/Alertness: Awake/alert Behavior During Therapy: WFL for tasks assessed/performed Overall Cognitive Status: Within Functional Limits for tasks assessed                                     General Comments  dressings intact with minor drainage, mild edema in R LE      Exercises Exercises: General Lower Extremity General Exercises - Lower Extremity Ankle Circles/Pumps: 20 reps;Left;Seated Long Arc Quad: AROM;Right;10 reps;Seated   Shoulder Instructions      Home Living Family/patient expects to be discharged to:: Private residence Living Arrangements: Alone Available Help at Discharge: Family;Available 24 hours/day Type of Home: House Home Access: Stairs to enter [employer] of Steps: 1   Home Layout: One level     Bathroom Shower/Tub: Occupational psychologist: Standard Bathroom Accessibility: Yes   Home Equipment: Environmental consultant - 2 wheels;Tub bench;Bedside commode          Prior Functioning/Environment Level of Independence: Independent                 OT Problem List: Decreased strength;Decreased range of motion;Decreased activity tolerance;Impaired balance (sitting and/or standing);Decreased knowledge of use of DME or AE;Decreased knowledge of precautions      OT Treatment/Interventions: Self-care/ADL training;Therapeutic exercise;Energy conservation;DME and/or AE instruction;Therapeutic activities;Patient/family education;Balance training    OT Goals(Current goals can be found in the care plan section) Acute Rehab OT Goals Patient Stated Goal: go home OT Goal Formulation: With patient Time For Goal Achievement: 07/04/19 Potential to Achieve Goals: Good  OT Frequency:  Min 2X/week   Barriers to D/C:            Co-evaluation PT/OT/SLP Co-Evaluation/Treatment: Yes Reason for Co-Treatment: For patient/therapist safety PT goals addressed during session: Mobility/safety with mobility;Proper use of DME OT goals addressed during session: ADL's and self-care      AM-PAC OT "6 Clicks" Daily Activity     Outcome Measure Help from another person eating meals?: None Help from another person taking care of personal grooming?: A Little Help from another person toileting, which includes using toliet, bedpan, or urinal?: A Little Help from another person bathing (including washing, rinsing, drying)?: A Little Help from another person to put on and taking off regular upper body clothing?: None Help from another person to put on and taking off regular lower body clothing?: A Little 6 Click Score: 20   End of Session Equipment Utilized During Treatment: Gait belt;Rolling walker Nurse Communication: Mobility status  Activity Tolerance: Patient tolerated treatment well Patient left: in chair;with call bell/phone within reach  OT Visit Diagnosis: Other abnormalities of gait and mobility (R26.89);Unsteadiness on feet (R26.81)                Time: 7619-5093 OT Time Calculation (min): 48 min Charges:  OT General Charges $OT Visit: 1 Visit OT Evaluation $OT Eval Moderate Complexity: [place], OT [employer] Pager [phone] Office [phone]   [name] 06/20/2019,  2:34 PM

## 2019-06-20 NOTE — Progress Notes (Signed)
Subjective: 1 Day Post-Op Procedure(s) (LRB): TOTAL HIP ARTHROPLASTY ANTERIOR APPROACH (Right) Patient reports pain as mild.  Tolerated surgery well.  No symptoms of covid 19.  Hgb stable post-op.  Vitals stable.  Objective: Vital signs in last 24 hours: Temp:  [97 F (36.1 C)-98.8 F (37.1 C)] 97.8 F (36.6 C) (12/08 0358) Pulse Rate:  [68-88] 68 (12/08 0407) Resp:  [12-22] 12 (12/08 0407) BP: (91-120)/(55-83) 107/58 (12/08 0358) SpO2:  [90 %-99 %] 95 % (12/08 0407) Weight:  [65.5 kg] 65.5 kg (12/08 0703)  Intake/Output from previous day: 12/07 0701 - 12/08 0700 In: 1571.2 [P.O.:280; I.V.:691.2; IV Piggyback:50] Out: 400 [Urine:300; Blood:100] Intake/Output this shift: No intake/output data recorded.  Recent Labs    06/19/19 0820 06/20/19 0500  HGB 12.5 11.0*   Recent Labs    06/19/19 0820 06/20/19 0500  WBC 4.7 4.9  RBC 4.17 3.66*  HCT 38.6 34.3*  PLT 165 144*   Recent Labs    06/19/19 0820 06/20/19 0500  NA 141 140  K 3.8 4.1  CL 108 106  CO2 23 22  BUN 20 16  CREATININE 0.60 0.51  GLUCOSE 114* 111*  CALCIUM 8.5* 8.4*   Recent Labs    06/19/19 0820  INR 1.1    Sensation intact distally Intact pulses distally Dorsiflexion/Plantar flexion intact Incision: dressing C/D/I   Assessment/Plan: 1 Day Post-Op Procedure(s) (LRB): TOTAL HIP ARTHROPLASTY ANTERIOR APPROACH (Right) Up with therapy Plan for discharge tomorrow Discharge home with home health  This patient most likely does not have Covid-19.  Two tests were negative and only one was positive.    Mcarthur Rossetti 06/20/2019, 8:28 AM

## 2019-06-20 NOTE — Evaluation (Signed)
Physical Therapy Evaluation Patient Details Name: Haley Marshall MRN: [id number] DOB: [birth year] Today's Date: 06/20/2019   History of Present Illness  63 yo female no sig nificant PMH experienced mechanical fall in daughter's yard and found to have displaced R femoral neck fx 12/05. Transferred to [facility] for R direct anterior total hip replacement by Dr Magnus Ivan. s/p sx 12/07.   Clinical Impression  PTA pt living alone in patio apartment with 1 small step to enter, completely independent. Pt is currently limited in safe mobility by pain, and decreased strength, and balance s/p THA. Pt requires min guard for bed mobility, minA-minguard for transfers and ambulation of 60 feet with RW. PT recommending HHPT level rehab at discharge to regain PLOF. PT will continue to follow acutely.     Follow Up Recommendations Home health PT;Supervision/Assistance - 24 hour    Equipment Recommendations  Other (comment)(pt daughter to bring RW)       Precautions / Restrictions Precautions Precautions: Fall Precaution Comments: direct R THA Restrictions Weight Bearing Restrictions: Yes RLE Weight Bearing: Weight bearing as tolerated      Mobility  Bed Mobility Overal bed mobility: Needs Assistance Bed Mobility: Supine to Sit     Supine to sit: Min guard;HOB elevated     General bed mobility comments: increased time and cues for technique, use of bedrail; minguard for safety  Transfers Overall transfer level: Needs assistance Equipment used: Rolling walker (2 wheeled) Transfers: Sit to/from Stand Sit to Stand: Min assist;Min guard         General transfer comment: initially min A from bed, min guard from toilet and subsequently from bed  Ambulation/Gait Ambulation/Gait assistance: Min assist;Min guard Gait Distance (Feet): 60 Feet Assistive device: Rolling walker (2 wheeled) Gait Pattern/deviations: Step-through pattern;Decreased step length - right;Decreased step length -  left;Decreased weight shift to right;Antalgic Gait velocity: slowed Gait velocity interpretation: <1.8 ft/sec, indicate of risk for recurrent falls General Gait Details: minA for steadying with initial ambulation to bathroom, progressing to min guard back to EoB by way of sink to wash hands. pt with c/o nausea and asked to sit, after drink of water and cool cloth on forehead as well as decreasing temperature in room. after 5 minute rest pt able to ambulate 60 feet in room with min guard with increasing stability with distance         Balance Overall balance assessment: Needs assistance Sitting-balance support: Feet supported Sitting balance-Leahy Scale: Good     Standing balance support: Bilateral upper extremity supported;No upper extremity supported;During functional activity;Single extremity supported Standing balance-Leahy Scale: Fair                               Pertinent Vitals/Pain Pain Assessment: No/denies pain    Home Living Family/patient expects to be discharged to:: Private residence Living Arrangements: Alone Available Help at Discharge: Family;Available 24 hours/day Type of Home: House Home Access: Stairs to enter   [employer] of Steps: 1 Home Layout: One level Home Equipment: Walker - 2 wheels;Tub bench;Bedside commode      Prior Function Level of Independence: Independent               Hand Dominance   Dominant Hand: Right    Extremity/Trunk Assessment   Upper Extremity Assessment Upper Extremity Assessment: Defer to OT evaluation    Lower Extremity Assessment Lower Extremity Assessment: RLE deficits/detail RLE Deficits / Details: R hip and knee ROM limited  by stiffness and swelling, strength grossly assessed from mobility to be 4/5 RLE: Unable to fully assess due to pain RLE Sensation: decreased light touch(post nerve block "different" feeling ) RLE Coordination: decreased fine motor       Communication    Communication: No difficulties  Cognition Arousal/Alertness: Awake/alert Behavior During Therapy: WFL for tasks assessed/performed Overall Cognitive Status: Within Functional Limits for tasks assessed                                        General Comments General comments (skin integrity, edema, etc.): dressings intact with minor drainage, mild edema in R LE      Exercises General Exercises - Lower Extremity Ankle Circles/Pumps: 20 reps;Left;Seated Long Arc Quad: AROM;Right;10 reps;Seated   Assessment/Plan    PT Assessment Patient needs continued PT services  PT Problem List Decreased range of motion;Decreased activity tolerance;Decreased mobility;Decreased balance;Decreased knowledge of use of DME       PT Treatment Interventions DME instruction;Gait training;Functional mobility training;Therapeutic activities;Therapeutic exercise;Balance training;Cognitive remediation;Patient/family education    PT Goals (Current goals can be found in the Care Plan section)  Acute Rehab PT Goals Patient Stated Goal: go home PT Goal Formulation: With patient Time For Goal Achievement: 07/04/19 Potential to Achieve Goals: Good    Frequency Min 4X/week   Barriers to discharge        Co-evaluation PT/OT/SLP Co-Evaluation/Treatment: Yes Reason for Co-Treatment: For patient/therapist safety PT goals addressed during session: Mobility/safety with mobility;Proper use of DME         AM-PAC PT "6 Clicks" Mobility  Outcome Measure Help needed turning from your back to your side while in a flat bed without using bedrails?: None Help needed moving from lying on your back to sitting on the side of a flat bed without using bedrails?: None Help needed moving to and from a bed to a chair (including a wheelchair)?: None Help needed standing up from a chair using your arms (e.g., wheelchair or bedside chair)?: None Help needed to walk in hospital room?: A Little Help needed  climbing 3-5 steps with a railing? : A Little 6 Click Score: 22    End of Session Equipment Utilized During Treatment: Gait belt Activity Tolerance: Patient tolerated treatment well Patient left: in chair;with call bell/phone within reach Nurse Communication: Mobility status PT Visit Diagnosis: Unsteadiness on feet (R26.81);Other abnormalities of gait and mobility (R26.89);Muscle weakness (generalized) (M62.81);History of falling (Z91.81);Difficulty in walking, not elsewhere classified (R26.2)    Time: 0981-1914 PT Time Calculation (min) (ACUTE ONLY): 48 min   Charges:   PT Evaluation $PT Eval Moderate Complexity: 1 Mod PT Treatments $Gait Training: 8-22 mins        Armonte Tortorella B. Migdalia Dk PT, DPT Acute Rehabilitation Services Pager [phone] Office [phone]   [place] 06/20/2019, 2:17 PM

## 2019-06-20 NOTE — TOC Initial Note (Addendum)
Transition of Care [facility]) - Initial/Assessment Note    Patient Details  Name: Haley Marshall MRN: [id number] Date of Birth: 01-01-1956  Transition of Care [facility]) CM/SW Contact:    Maryclare Labrador, RN Phone Number: 06/20/2019, 12:42 PM  Clinical Narrative:    Pt is now s/p hip surgery secondary to a fall.  Pt is from [place] and plans to return upon discharge.  Pts home address is [place] [address], pts contact number is [phone].        Pt is recommended for HH.  Pts daughter will transport pt home and will also stay with pt at discharge.  Pts daughter will provide rolling walker.   CM provided medicare.gov HH choice - pt chose [facility] - agency contacted and referral accepted.  CM faxed request documents to agency - agency given pts medicare insurance information, address and phone number.  [facility] is not updated with current information - pt/daughter to provide to [facility] admitting department.     Pt has tested negative for COVID x2        Expected Discharge Plan: [place] Barriers to Discharge: Continued Medical Work up   Patient Goals and CMS Choice   CMS Medicare.gov Compare Post Acute Care list provided to:: Patient Choice offered to / list presented to : Patient  Expected Discharge Plan and Services Expected Discharge Plan: [place]       Living arrangements for the past 2 months: Single Family Home                 DME Arranged: Brace, Leg         HH Arranged: PT HH Agency: [facility] Date [place]: 06/20/19 Time [place]: 1914 Representative spoke with at [place]: Anderson [place]  Prior Living Arrangements/Services Living arrangements for the past 2 months: Single Family Home Lives with:: Self Patient language and need for interpreter reviewed:: Yes Do you feel safe going back to the place where you live?: Yes      Need for Family Participation in Patient Care: Yes  (Comment) Care giver support system in place?: Yes (comment)   Criminal Activity/Legal Involvement Pertinent to Current Situation/Hospitalization: No - Comment as needed  Activities of Daily Living Home Assistive Devices/Equipment: Eyeglasses ADL Screening (condition at time of admission) Patient's cognitive ability adequate to safely complete daily activities?: Yes Is the patient deaf or have difficulty hearing?: No Does the patient have difficulty seeing, even when wearing glasses/contacts?: No Does the patient have difficulty concentrating, remembering, or making decisions?: Yes Patient able to express need for assistance with ADLs?: Yes Does the patient have difficulty dressing or bathing?: No Independently performs ADLs?: Yes (appropriate for developmental age) Does the patient have difficulty walking or climbing stairs?: Yes Weakness of Legs: Right Weakness of Arms/Hands: None  Permission Sought/Granted   Permission granted to share information with : Yes, Verbal Permission Granted     Permission granted to share info w AGENCY: [facility]        Emotional Assessment   Attitude/Demeanor/Rapport: Engaged, Self-Confident, Gracious Affect (typically observed): Accepting Orientation: : Oriented to Self, Oriented to Place, Oriented to  Time, Oriented to Situation   Psych Involvement: No (comment)  Admission diagnosis:  FX-FEMUR NOS Patient Active Problem List   Diagnosis Date Noted  . Closed displaced fracture of neck of right femur ([place]) 06/18/2019  . Closed right hip fracture, initial encounter ([place]) 06/18/2019   PCP:  No  primary care provider on file. Pharmacy:   [facility] [address], [place] - [address] [place] [address] Phone: [phone] Fax: [phone]     Social Determinants of Health (SDOH) Interventions    Readmission Risk Interventions No flowsheet data found.

## 2019-06-21 ENCOUNTER — Other Ambulatory Visit: Payer: Self-pay | Admitting: Orthopaedic Surgery

## 2019-06-21 MED ORDER — ONDANSETRON 4 MG PO TBDP: 4.0000 mg | ORAL_TABLET | Freq: Three times a day (TID) | ORAL | 0 refills | Status: DC | PRN

## 2019-06-21 MED ORDER — TRAMADOL HCL 50 MG PO TABS: 50.0000 mg | ORAL_TABLET | Freq: Four times a day (QID) | ORAL | 0 refills | Status: AC | PRN

## 2019-06-21 MED ORDER — METHOCARBAMOL 500 MG PO TABS: 500.0000 mg | ORAL_TABLET | Freq: Four times a day (QID) | ORAL | 0 refills | Status: AC | PRN

## 2019-06-21 MED ORDER — PROMETHAZINE HCL 12.5 MG PO TABS: 12.5000 mg | ORAL_TABLET | Freq: Three times a day (TID) | ORAL | 0 refills | Status: DC | PRN

## 2019-06-21 NOTE — Progress Notes (Signed)
Patient ID: Haley Marshall, female   DOB: [birth year], 63 y.o.   MRN: [id number] The patient looks really good this morning.  She did tolerate physical therapy yesterday.  They will work with her 1 more time today before her being discharged home to [place] this afternoon.  Her right hip incision dressing is clean and dry.  Her leg lengths are equal.  She tolerates me putting her right hip the range of motion.  She continues to have stable vital signs.  She is completely asymptomatic in terms of COVID-19.

## 2019-06-21 NOTE — Progress Notes (Signed)
Physical Therapy Treatment Patient Details Name: Haley Marshall MRN: [id number] DOB: Apr 14, 1956 Today's Date: 06/21/2019    History of Present Illness 63 yo female no sig nificant PMH experienced mechanical fall in daughter's yard and found to have displaced R femoral neck fx 12/05. Transferred to [facility] for R direct anterior total hip replacement by Dr Magnus Ivan. s/p sx 12/07.     PT Comments    Pt is making excellent progress towards her goals.  Pt limited in safe mobility by burning pain in her R quadriceps and decreased strength and balance as a result. Pt is min guard for safety with transfers and ambulation and stair training with RW. Pt also educated in supine, sitting and standing exercises. Pt to discharge home today and HHPT will start tomorrow.     Follow Up Recommendations  Home health PT;Supervision/Assistance - 24 hour     Equipment Recommendations  Other (comment)(daughter to bring RW)       Precautions / Restrictions Precautions Precautions: Fall Precaution Comments: direct R THA Restrictions Weight Bearing Restrictions: Yes RLE Weight Bearing: Weight bearing as tolerated    Mobility  Bed Mobility               General bed mobility comments: sitting EoB on entry   Transfers Overall transfer level: Needs assistance Equipment used: Rolling walker (2 wheeled) Transfers: Sit to/from Stand Sit to Stand: Min guard         General transfer comment: minguard for safety, pt with safe hand placement during transitions  Ambulation/Gait Ambulation/Gait assistance: Min guard Gait Distance (Feet): 30 Feet Assistive device: Rolling walker (2 wheeled) Gait Pattern/deviations: Step-through pattern;Decreased step length - right;Decreased step length - left;Decreased weight shift to right;Antalgic Gait velocity: slowed Gait velocity interpretation: <1.8 ft/sec, indicate of risk for recurrent falls General Gait Details: min guard for safety, slightly antalgic  gait, that improves with distance    Stairs Stairs: Yes Stairs assistance: Min guard Stair Management: With walker;No rails;Forwards;Step to pattern Number of Stairs: 1 General stair comments: min guard for safety, steady up and over vc for sequencing,          Balance Overall balance assessment: Needs assistance Sitting-balance support: Feet supported Sitting balance-Leahy Scale: Good     Standing balance support: Bilateral upper extremity supported;No upper extremity supported;During functional activity;Single extremity supported Standing balance-Leahy Scale: Fair                              Cognition Arousal/Alertness: Awake/alert Behavior During Therapy: WFL for tasks assessed/performed Overall Cognitive Status: Within Functional Limits for tasks assessed                                        Exercises Total Joint Exercises Ankle Circles/Pumps: AROM;Both;20 reps;Seated Hip ABduction/ADduction: AROM;10 reps;Standing Long Arc Quad: AROM;Right;10 reps;Seated Knee Flexion: AROM;Right;10 reps;Standing Marching in Standing: AROM;10 reps;Right;Standing Standing Hip Extension: Right;AROM;10 reps;Standing    General Comments General comments (skin integrity, edema, etc.): dressings intact      Pertinent Vitals/Pain Pain Assessment: Faces Faces Pain Scale: Hurts a little bit Pain Location: RLE, incisional Pain Descriptors / Indicators: Heaviness;Burning Pain Intervention(s): Limited activity within patient's tolerance;Monitored during session;Repositioned;Patient requesting pain meds-RN notified           PT Goals (current goals can now be found in the care plan section) Acute Rehab PT  Goals Patient Stated Goal: home today PT Goal Formulation: With patient Time For Goal Achievement: 07/04/19 Potential to Achieve Goals: Good Progress towards PT goals: Progressing toward goals    Frequency    Min 4X/week      PT Plan Current  plan remains appropriate       AM-PAC PT "6 Clicks" Mobility   Outcome Measure  Help needed turning from your back to your side while in a flat bed without using bedrails?: None Help needed moving from lying on your back to sitting on the side of a flat bed without using bedrails?: None Help needed moving to and from a bed to a chair (including a wheelchair)?: None Help needed standing up from a chair using your arms (e.g., wheelchair or bedside chair)?: None Help needed to walk in hospital room?: None Help needed climbing 3-5 steps with a railing? : A Little 6 Click Score: 23    End of Session Equipment Utilized During Treatment: Gait belt Activity Tolerance: Patient tolerated treatment well Patient left: in chair;with call bell/phone within reach Nurse Communication: Mobility status PT Visit Diagnosis: Unsteadiness on feet (R26.81);Other abnormalities of gait and mobility (R26.89);Muscle weakness (generalized) (M62.81);History of falling (Z91.81);Difficulty in walking, not elsewhere classified (R26.2)     Time: 9678-9381 PT Time Calculation (min) (ACUTE ONLY): 44 min  Charges:  $Gait Training: 8-22 mins $Therapeutic Exercise: 23-37 mins                     Katelyn Kohlmeyer B. Migdalia Dk PT, DPT Acute Rehabilitation Services Pager [phone] Office [phone]    [place] 06/21/2019, 1:18 PM

## 2019-06-21 NOTE — Discharge Instructions (Signed)

## 2019-06-21 NOTE — Progress Notes (Signed)
Cristopher Peru to be D/C'd Home per MD order.  Discussed with the patient and all questions fully answered.  VSS, Skin clean, dry and intact without evidence of skin break down, no evidence of skin tears noted. IV catheter discontinued intact. Site without signs and symptoms of complications. Dressing and pressure applied.  An After Visit Summary was printed and given to the patient. Patient received prescription.  D/c education completed with patient/family including follow up instructions, medication list, d/c activities limitations if indicated, with other d/c instructions as indicated by MD - patient able to verbalize understanding, all questions fully answered.   Patient instructed to return to ED, call 911, or call MD for any changes in condition.   Patient escorted via [place], and D/C home via private auto.   Lorenza Evangelist Montejano 06/21/2019 12:02 PM

## 2019-06-21 NOTE — Discharge Summary (Signed)
Patient ID: Haley Marshall MRN: [id number] DOB/AGE: 04/04/1956 63 y.o.  Admit date: 06/18/2019 Discharge date: 06/21/2019  Admission Diagnoses:  Principal Problem:   Closed displaced fracture of neck of right femur [facility]) Active Problems:   Closed right hip fracture, initial encounter [facility])   Discharge Diagnoses:  Same  History reviewed. No pertinent past medical history.  Surgeries: Procedure(s): TOTAL HIP ARTHROPLASTY ANTERIOR APPROACH on 06/19/2019   Consultants:   Discharged Condition: Improved  Hospital Course: Haley Marshall is an 63 y.o. female who was admitted 06/18/2019 for operative treatment ofClosed displaced fracture of neck of right femur ([place]). Patient has severe unremitting pain that affects sleep, daily activities, and work/hobbies. After pre-op clearance the patient was taken to the operating room on 06/19/2019 and underwent  Procedure(s): TOTAL HIP ARTHROPLASTY ANTERIOR APPROACH.    Patient was given perioperative antibiotics:  Anti-infectives (From admission, onward)   Start     Dose/Rate Route Frequency Ordered Stop   06/20/19 1000  hydroxychloroquine (PLAQUENIL) tablet 200 mg     200 mg Oral Daily 06/19/19 2152     06/20/19 0000  ceFAZolin (ANCEF) IVPB 1 g/50 mL premix     1 g 100 mL/hr over 30 Minutes Intravenous Every 6 hours 06/19/19 2152 06/20/19 4098       Patient was given sequential compression devices, early ambulation, and chemoprophylaxis to prevent DVT.  Patient benefited maximally from hospital stay and there were no complications.    Recent vital signs:  Patient Vitals for the past 24 hrs:  BP Temp Temp src Pulse Resp SpO2 Weight  06/21/19 0641 - - - - - - 67.3 kg  06/21/19 0400 122/64 97.8 F (36.6 C) - - 18 - -  06/21/19 0059 (!) 109/51 (!) 97.4 F (36.3 C) - - 17 - -  06/20/19 2355 (!) 115/55 - - - - - -  06/20/19 2353 103/61 - - - - - -  06/20/19 2002 (!) 124/52 (!) 97.5 F (36.4 C) - 83 18 98 % -  06/20/19 1641 (!) 124/55 98.3 F  (36.8 C) Oral 89 18 98 % -  06/20/19 0837 108/60 - - 84 20 98 % -     Recent laboratory studies:  Recent Labs    06/19/19 0820 06/20/19 0500  WBC 4.7 4.9  HGB 12.5 11.0*  HCT 38.6 34.3*  PLT 165 144*  NA 141 140  K 3.8 4.1  CL 108 106  CO2 23 22  BUN 20 16  CREATININE 0.60 0.51  GLUCOSE 114* 111*  INR 1.1  --   CALCIUM 8.5* 8.4*     Discharge Medications:   Allergies as of 06/21/2019      Reactions   Tetracyclines & Related Nausea And Vomiting   Celebrex [celecoxib] Rash      Medication List    TAKE these medications   hydroxychloroquine 200 MG tablet Commonly known as: PLAQUENIL Take 200 mg by mouth daily.   methocarbamol 500 MG tablet Commonly known as: ROBAXIN Take 1 tablet (500 mg total) by mouth every 6 (six) hours as needed for muscle spasms.   ondansetron 4 MG disintegrating tablet Commonly known as: Zofran ODT Take 1 tablet (4 mg total) by mouth every 8 (eight) hours as needed for nausea or vomiting.   OVER THE COUNTER MEDICATION Take 1 capsule by mouth daily. Vitamin C Complex   traMADol 50 MG tablet Commonly known as: Ultram Take 1-2 tablets (50-100 mg total) by mouth every 6 (six) hours as needed.  Vitamin D3 125 MCG (5000 UT) Caps Take 1 capsule by mouth daily.            Durable Medical Equipment  (From admission, onward)         Start     Ordered   06/19/19 2152  DME Walker rolling  Once    Question:  Patient needs a walker to treat with the following condition  Answer:  Status post total replacement of right hip   06/19/19 2152          Diagnostic Studies: Dg Pelvis Portable  Result Date: 06/19/2019 CLINICAL DATA:  Status post right hip replacement EXAM: PORTABLE PELVIS 1-2 VIEWS COMPARISON:  Intraop films from earlier in the same day. FINDINGS: Right hip replacement is noted in satisfactory position. No acute bony abnormality is seen. No soft tissue changes are noted. IMPRESSION: Status post right hip replacement  Electronically Signed   By: Alcide Clever M.D.   On: 06/19/2019 20:30   Dg C-arm 1-60 Min  Result Date: 06/19/2019 CLINICAL DATA:  Right hip replacement EXAM: OPERATIVE RIGHT HIP WITH PELVIS; DG C-ARM 1-60 MIN COMPARISON:  None. FLUOROSCOPY TIME:  Radiation Exposure Index (as provided by the fluoroscopic device): Not available If the device does not provide the exposure index: Fluoroscopy Time:  34 seconds Number of Acquired Images:  8 FINDINGS: Multiple spot films were obtained during placement of a right hip replacement. Completion films demonstrate the prosthesis to be in satisfactory position. No acute bony abnormality is noted. IMPRESSION: Status post right hip replacement. Electronically Signed   By: Alcide Clever M.D.   On: 06/19/2019 19:07   Dg Hip Operative Unilat W Or W/o Pelvis Right  Result Date: 06/19/2019 CLINICAL DATA:  Right hip replacement EXAM: OPERATIVE RIGHT HIP WITH PELVIS; DG C-ARM 1-60 MIN COMPARISON:  None. FLUOROSCOPY TIME:  Radiation Exposure Index (as provided by the fluoroscopic device): Not available If the device does not provide the exposure index: Fluoroscopy Time:  34 seconds Number of Acquired Images:  8 FINDINGS: Multiple spot films were obtained during placement of a right hip replacement. Completion films demonstrate the prosthesis to be in satisfactory position. No acute bony abnormality is noted. IMPRESSION: Status post right hip replacement. Electronically Signed   By: Alcide Clever M.D.   On: 06/19/2019 19:07    Disposition: Discharge disposition: 01-Home or Self Care         Follow-up Information    Kathryne Hitch, MD. Schedule an appointment as soon as possible for a visit in 2 week(s).   Specialty: Orthopedic Surgery Contact information: [address] [place] [address] [phone]            Signed: Kathryne Hitch 06/21/2019, 7:23 AM

## 2019-06-21 NOTE — Progress Notes (Signed)
Occupational Therapy Treatment Patient Details Name: Haley Marshall MRN: [id number] DOB: 1955/11/16 Today's Date: 06/21/2019    History of present illness 63 yo female no sig nificant PMH experienced mechanical fall in daughter's yard and found to have displaced R femoral neck fx 12/05. Transferred to [facility] for R direct anterior total hip replacement by Dr Haley Marshall. s/p sx 12/07.    OT comments  Pt making steady progress towards OT goals. Pt completing room level mobility using RW, standing grooming and LB bathing ADL at minguard assist level. Pt requiring light minA for toileting and LB dressing ADL. Overall pt tolerating well and with good progress during this admission. Pt plans to return home with daughter initially for assist with ADL PRN. Will continue to follow while she remains in acute setting to progress pt towards established OT goals.   Follow Up Recommendations  No OT follow up;Supervision/Assistance - 24 hour(24hr initially)    Equipment Recommendations  None recommended by OT(pt reports able to borrow necessary equipment)          Precautions / Restrictions Precautions Precautions: Fall Precaution Comments: direct R THA Restrictions Weight Bearing Restrictions: Yes RLE Weight Bearing: Weight bearing as tolerated       Mobility Bed Mobility               General bed mobility comments: Pt OOB in recliner  Transfers Overall transfer level: Needs assistance Equipment used: Rolling walker (2 wheeled) Transfers: Sit to/from Stand Sit to Stand: Min guard         General transfer comment: minguard for safety, pt with safe hand placement during transitions    Balance Overall balance assessment: Needs assistance Sitting-balance support: Feet supported Sitting balance-Haley Marshall: Good     Standing balance support: Bilateral upper extremity supported;No upper extremity supported;During functional activity;Single extremity supported Standing  balance-Haley Marshall: Fair                             ADL either performed or assessed with clinical judgement   ADL Overall ADL's : Needs assistance/impaired     Grooming: Wash/dry hands;Standing;Supervision/safety;Wash/dry face;Modified independent;Sitting Grooming Details (indicate cue type and reason): performed ADL in standing and sitting  Upper Body Bathing: Set up;Sitting Upper Body Bathing Details (indicate cue type and reason): seated at sink Lower Body Bathing: Min guard;Sit to/from stand Lower Body Bathing Details (indicate cue type and reason): seated at sink Upper Body Dressing : Modified independent;Sitting   Lower Body Dressing: Min guard;Minimal assistance;Sit to/from stand Lower Body Dressing Details (indicate cue type and reason): light minA for pantleg over RLE, also partly due to grip on socks; reviewed compensatory strategies for performing task; minguard for standing balance Toilet Transfer: Min guard;Ambulation;Regular Toilet;Grab bars;RW   Toileting- Architect and Hygiene: Supervision/safety;Sit to/from stand;Sitting/lateral lean;Minimal assistance Toileting - Clothing Manipulation Details (indicate cue type and reason): light assist for gown management only   Tub/Shower Transfer Details (indicate cue type and reason): verbally reviewed shower transfer technique  Functional mobility during ADLs: Min guard;Rolling walker       Vision       Perception     Praxis      Cognition Arousal/Alertness: Awake/alert Behavior During Therapy: WFL for tasks assessed/performed Overall Cognitive Status: Within Functional Limits for tasks assessed  Exercises     Shoulder Instructions       General Comments      Pertinent Vitals/ Pain       Pain Assessment: Faces Faces Pain Marshall: Hurts little more Pain Location: RLE, incisional Pain Descriptors / Indicators:  Heaviness;Burning Pain Intervention(s): Limited activity within patient's tolerance;Monitored during session;Premedicated before session;Repositioned  Home Living                                          Prior Functioning/Environment              Frequency  Min 2X/week        Progress Toward Goals  OT Goals(current goals can now be found in the care plan section)  Progress towards OT goals: Progressing toward goals  Acute Rehab OT Goals Patient Stated Goal: home today OT Goal Formulation: With patient Time For Goal Achievement: 07/04/19 Potential to Achieve Goals: Good  Plan Discharge plan remains appropriate    Co-evaluation                 AM-PAC OT "6 Clicks" Daily Activity     Outcome Measure   Help from another person eating meals?: None Help from another person taking care of personal grooming?: A Little Help from another person toileting, which includes using toliet, bedpan, or urinal?: A Little Help from another person bathing (including washing, rinsing, drying)?: A Little Help from another person to put on and taking off regular upper body clothing?: None Help from another person to put on and taking off regular lower body clothing?: A Little 6 Click Score: 20    End of Session Equipment Utilized During Treatment: Gait belt;Rolling walker  OT Visit Diagnosis: Other abnormalities of gait and mobility (R26.89);Unsteadiness on feet (R26.81)   Activity Tolerance Patient tolerated treatment well   Patient Left in chair;with call bell/phone within reach   Nurse Communication Mobility status        Time: 4034-7425 OT Time Calculation (min): 33 min  Charges: OT General Charges $OT Visit: 1 Visit OT Treatments $Self Care/Home Management : 23-37 mins  Haley Marshall, OT Supplemental Rehabilitation Services Pager [phone] Office [phone]    Haley Marshall 06/21/2019, 12:31 PM

## 2019-06-21 NOTE — TOC Transition Note (Signed)
Transition of Care [facility]) - CM/SW Discharge Note   Patient Details  Name: Haley Marshall MRN: [id number] Date of Birth: 05-26-1956  Transition of Care [facility]) CM/SW Contact:  Maryclare Labrador, RN Phone Number: 06/21/2019, 8:15 AM   Clinical Narrative:   Pt will discharge home today.  CM confirmed with [facility] in [place] that pt will have HHPT initiated tomorrow 12/8.  Pt will contact her PCP and request a follow up appt.  Pt has thermometer in the home.  No other needs determined - CM signing off    Final next level of care: Home w Home Health Services Barriers to Discharge: Barriers Resolved   Patient Goals and CMS Choice   CMS Medicare.gov Compare Post Acute Care list provided to:: Patient Choice offered to / list presented to : Patient  Discharge Placement                       Discharge Plan and Services                DME Arranged: Brace, Leg         HH Arranged: PT HH Agency: [facility] Date [place]: 06/20/19 Time [place]: 3212 Representative spoke with at [place]: [place] ([place]) Interventions     Readmission Risk Interventions No flowsheet data found.

## 2019-06-22 NOTE — Progress Notes (Deleted)
Note written while pt actively discharging and note did not post for service on 06/21/19 at 845.  PT Progress Note  Clinical Impression:  Pt is making excellent progress towards her goals and is min guard for ambulation with RW and stair training for safe mobility in her home environment. Pt was provided extensive education on supine, seated and standing therapeutic exercise. Pt will receive HHPT at discharge.    06/21/19 1300  PT Visit Information  Last PT Received On 06/21/19  Assistance Needed +1  History of Present Illness 63 yo female no sig nificant PMH experienced mechanical fall in daughter's yard and found to have displaced R femoral neck fx 12/05. Transferred to [facility] for R direct anterior total hip replacement by Dr Magnus Ivan. s/p sx 12/07.   Subjective Data  Patient Stated Goal home today  Precautions  Precautions Fall  Precaution Comments direct R THA  Restrictions  Weight Bearing Restrictions Yes  RLE Weight Bearing WBAT  Pain Assessment  Pain Assessment Faces  Faces Pain Scale 2  Pain Location RLE, incisional  Pain Descriptors / Indicators Heaviness;Burning  Pain Intervention(s) Limited activity within patient's tolerance;Monitored during session;Repositioned;Patient requesting pain meds-RN notified  Cognition  Arousal/Alertness Awake/alert  Behavior During Therapy WFL for tasks assessed/performed  Overall Cognitive Status Within Functional Limits for tasks assessed  Bed Mobility  General bed mobility comments sitting EoB on entry   Transfers  Overall transfer level Needs assistance  Equipment used Rolling walker (2 wheeled)  Transfers Sit to/from Stand  Sit to Stand Min guard  General transfer comment minguard for safety, pt with safe hand placement during transitions  Ambulation/Gait  Ambulation/Gait assistance Min guard  Gait Distance (Feet) 30 Feet  Assistive device Rolling walker (2 wheeled)  Gait Pattern/deviations Step-through pattern;Decreased  step length - right;Decreased step length - left;Decreased weight shift to right;Antalgic  General Gait Details min guard for safety, slightly antalgic gait, that improves with distance   Gait velocity slowed  Gait velocity interpretation <1.8 ft/sec, indicate of risk for recurrent falls  Stairs Yes  Stairs assistance Min guard  Stair Management With walker;No rails;Forwards;Step to pattern  Number of Stairs 1  General stair comments min guard for safety, steady up and over vc for sequencing,   Balance  Overall balance assessment Needs assistance  Sitting-balance support Feet supported  Sitting balance-Leahy Scale Good  Standing balance support Bilateral upper extremity supported;No upper extremity supported;During functional activity;Single extremity supported  Standing balance-Leahy Scale Fair  General Comments  General comments (skin integrity, edema, etc.) dressings intact  Exercises  Exercises Total Joint  Total Joint Exercises  Ankle Circles/Pumps AROM;Both;20 reps;Seated  Hip ABduction/ADduction AROM;10 reps;Standing  Long Arc Quad AROM;Right;10 reps;Seated  Knee Flexion AROM;Right;10 reps;Standing  Marching in Standing AROM;10 reps;Right;Standing  Standing Hip Extension Right;AROM;10 reps;Standing  PT - End of Session  Equipment Utilized During Treatment Gait belt  Activity Tolerance Patient tolerated treatment well  Patient left in chair;with call bell/phone within reach  Nurse Communication Mobility status   PT - Assessment/Plan  PT Plan Current plan remains appropriate  PT Visit Diagnosis Unsteadiness on feet (R26.81);Other abnormalities of gait and mobility (R26.89);Muscle weakness (generalized) (M62.81);History of falling (Z91.81);Difficulty in walking, not elsewhere classified (R26.2)  PT Frequency (ACUTE ONLY) Min 4X/week  Follow Up Recommendations Home health PT;Supervision/Assistance - 24 hour  PT equipment Other (comment) (daughter to bring RW)  AM-PAC PT "6  Clicks" Mobility Outcome Measure (Version 2)  Help needed turning from your back to your side while in  a flat bed without using bedrails? 4  Help needed moving from lying on your back to sitting on the side of a flat bed without using bedrails? 4  Help needed moving to and from a bed to a chair (including a wheelchair)? 4  Help needed standing up from a chair using your arms (e.g., wheelchair or bedside chair)? 4  Help needed to walk in hospital room? 4  Help needed climbing 3-5 steps with a railing?  3  6 Click Score 23  Consider Recommendation of Discharge To: Home with no services  PT Goal Progression  Progress towards PT goals Progressing toward goals  Acute Rehab PT Goals  PT Goal Formulation With patient  Time For Goal Achievement 07/04/19  Potential to Achieve Goals Good  PT Time Calculation  PT Start Time (ACUTE ONLY) 0845  PT Stop Time (ACUTE ONLY) 0929  PT Time Calculation (min) (ACUTE ONLY) 44 min  PT General Charges  $$ ACUTE PT VISIT 1 Visit  PT Treatments  $Gait Training 8-22 mins  $Therapeutic Exercise 23-37 mins   Jaxyn Mestas B. Migdalia Dk PT, DPT Acute Rehabilitation Services Pager [phone] Office [phone]

## 2019-06-28 ENCOUNTER — Encounter: Payer: Self-pay | Admitting: Anesthesiology

## 2019-06-28 NOTE — Anesthesia Postprocedure Evaluation (Signed)
Anesthesia Post Note  Patient: Shantella Blubaugh  Procedure(s) Performed: TOTAL HIP ARTHROPLASTY ANTERIOR APPROACH (Right Hip)     Patient location during evaluation: PACU Anesthesia Type: MAC and Spinal Level of consciousness: awake and alert Pain management: pain level controlled Vital Signs Assessment: post-procedure vital signs reviewed and stable Respiratory status: spontaneous breathing, nonlabored ventilation, respiratory function stable and patient connected to nasal cannula oxygen Cardiovascular status: stable and blood pressure returned to baseline Postop Assessment: no apparent nausea or vomiting and spinal receding Anesthetic complications: no    Last Vitals:  Vitals:   06/21/19 0059 06/21/19 0400  BP: (!) 109/51 122/64  Pulse:    Resp: 17 18  Temp: (!) 36.3 C 36.6 C  SpO2:      Last Pain:  Vitals:   06/21/19 0150  TempSrc:   PainSc: Asleep                 Denean Pavon

## 2019-07-03 ENCOUNTER — Encounter: Payer: Self-pay | Admitting: Orthopaedic Surgery

## 2019-07-03 ENCOUNTER — Ambulatory Visit (INDEPENDENT_AMBULATORY_CARE_PROVIDER_SITE_OTHER): Payer: Medicare Other | Admitting: Orthopaedic Surgery

## 2019-07-03 ENCOUNTER — Other Ambulatory Visit: Payer: Self-pay

## 2019-07-03 DIAGNOSIS — S72001A Fracture of unspecified part of neck of right femur, initial encounter for closed fracture: Secondary | ICD-10-CM

## 2019-07-03 DIAGNOSIS — Z96641 Presence of right artificial hip joint: Secondary | ICD-10-CM

## 2019-07-03 NOTE — Progress Notes (Signed)
The patient comes today 2 weeks after a right total hip arthroplasty done through direct anterior approach.  This was done to treat an acute femoral neck fracture.  She is a very active 63 year old female.  She comes today with just a little bit of soreness.  She is ambulating without any assistive device at all.  She is not taking pain medications either.  On exam she does not walk with any significant limp.  Her leg lengths are equal.  She tolerates me putting her hip the range of motion easily.  Her incisions healed nicely.  I remove the old Steri-Strips in place new ones.  At this point she will continue increase her activities as comfort allows.  Since she is doing so well I really do not need to see her back for 6 months.  At that visit I would like a standing low AP pelvis and lateral of her right operative hip.  She understands if there is any issues before then she should let us know.  All question concerns were answered and addressed.

## 2019-07-04 ENCOUNTER — Telehealth: Payer: Self-pay | Admitting: Orthopaedic Surgery

## 2019-07-04 NOTE — Telephone Encounter (Signed)
Can you write me some PT orders for her please. I will put them on a script and fax them

## 2019-07-04 NOTE — Telephone Encounter (Signed)
Lysbeth Galas with [facility] called. He says they need a PT referral for patient faxed to [phone]. His call back number is [phone]

## 2019-07-04 NOTE — Telephone Encounter (Signed)
She looked so good yesterday that she doesn't really need any therapy at this standpoint.

## 2019-07-05 NOTE — Telephone Encounter (Signed)
Haley Marshall aware of the below message

## 2019-07-12 ENCOUNTER — Telehealth: Payer: Self-pay | Admitting: Orthopaedic Surgery

## 2019-07-12 NOTE — Telephone Encounter (Signed)
Please advise 

## 2019-07-12 NOTE — Telephone Encounter (Signed)
She seemed to be doing really well after our last visit so I did not set her up for any outpatient physical therapy which we usually do not do for total hips unless someone is really requesting it.  If she would like to have outpatient physical therapy, we can have her give [place] a number and her area of [place] so we can schedule this for her she can schedule it and we can send her a prescription for outpatient physical therapy.  We would just want them to work on balance and coordination as well as gait training and strengthening of the operative hip.

## 2019-07-12 NOTE — Telephone Encounter (Signed)
Faxed Rx to provided number she gave me  [facility] Fax:[phone]

## 2019-07-12 NOTE — Telephone Encounter (Signed)
Patient think she should be doing physical therapy and request a call asap.

## 2019-11-08 ENCOUNTER — Encounter: Payer: Self-pay | Admitting: Orthopaedic Surgery

## 2020-01-01 ENCOUNTER — Other Ambulatory Visit: Payer: Self-pay

## 2020-01-01 ENCOUNTER — Ambulatory Visit (INDEPENDENT_AMBULATORY_CARE_PROVIDER_SITE_OTHER): Payer: Medicare Other

## 2020-01-01 ENCOUNTER — Encounter: Payer: Self-pay | Admitting: Orthopaedic Surgery

## 2020-01-01 ENCOUNTER — Ambulatory Visit (INDEPENDENT_AMBULATORY_CARE_PROVIDER_SITE_OTHER): Payer: Medicare Other | Admitting: Orthopaedic Surgery

## 2020-01-01 DIAGNOSIS — Z96641 Presence of right artificial hip joint: Secondary | ICD-10-CM

## 2020-01-01 DIAGNOSIS — M25562 Pain in left knee: Secondary | ICD-10-CM

## 2020-01-01 NOTE — Progress Notes (Signed)
Office Visit Note   Patient: Haley Marshall           Date of Birth: Oct 05, 1955           MRN: [id number] Visit Date: 01/01/2020              Requested by: No referring provider defined for this encounter. PCP: System, Pcp Not In   Assessment & Plan: Visit Diagnoses:  1. Status post total replacement of right hip   2. Left knee pain, unspecified chronicity     Plan: We did talk to her about quad strengthening exercises and activity modification for her left knee. My neck step would be to consider an aspiration with steroid injection and potentially hyaluronic acid. However this is not a daily pain states she is we will work on just activity modification and quad strengthening for now. From a hip standpoint follow-up can be as needed. She understands that if she develops any issues with the right hip she will let us know. All questions and concerns were answered and addressed.  Follow-Up Instructions: Return if symptoms worsen or fail to improve.   Orders:  Orders Placed This Encounter  Procedures  . XR HIP UNILAT W OR W/O PELVIS 1V RIGHT  . XR Knee 1-2 Views Left   No orders of the defined types were placed in this encounter.     Procedures: No procedures performed   Clinical Data: No additional findings.   Subjective: Chief Complaint  Patient presents with  . Right Hip - Follow-up  Patient is a very pleasant 64 year old female who is about 6 months out from a right anterior hip replacement to treat a displaced right hip femoral neck fracture. She says her right hip is doing well. She has been treated for osteoporosis and is now taking Fosamax. She has been dealing with some left knee pain. She says is more bothersome going up and down stairs. She is never injured the left knee before or had surgery on the left knee. She has had no other acute change in her medical status.  HPI  Review of Systems She currently denies any headache, chest pain, shortness of breath,  fever, chills, nausea, vomiting  Objective: Vital Signs: There were no vitals taken for this visit.  Physical Exam She is alert and orient x3 and in no acute distress Ortho Exam She ambulates with a normal gait. There is no leg length discrepancy. Her right operative hip exam is normal with full and fluid range of motion with no pain. Examination of the left knee shows a mild effusion. There is patellofemoral crepitation and grinding with flexion and extension. The knee is ligamentously stable. There is no significant medial or lateral joint line tenderness. Specialty Comments:  No specialty comments available.  Imaging: XR HIP UNILAT W OR W/O PELVIS 1V RIGHT  Result Date: 01/01/2020 An AP pelvis and lateral of the right hip shows a well-seated total hip arthroplasty with no complicating features.  XR Knee 1-2 Views Left  Result Date: 01/01/2020 Two views of the left knee show no acute findings. The medial lateral joint space shows just some slight narrowing. There is severe patellofemoral arthritic changes.    PMFS History: Patient Active Problem List   Diagnosis Date Noted  . Closed displaced fracture of neck of right femur (HCC) 06/18/2019  . Closed right hip fracture, initial encounter (HCC) 06/18/2019   History reviewed. No pertinent past medical history.  History reviewed. No pertinent family history.  Past  Surgical History:  Procedure Laterality Date  . TOTAL HIP ARTHROPLASTY Right 06/19/2019   Procedure: TOTAL HIP ARTHROPLASTY ANTERIOR APPROACH;  Surgeon: Mcarthur Rossetti, MD;  Location: [facility];  Service: Orthopedics;  Laterality: Right;   Social History   Occupational History  . Not on file  Tobacco Use  . Smoking status: Never Smoker  . Smokeless tobacco: Never Used  Vaping Use  . Vaping Use: Never used  Substance and Sexual Activity  . Alcohol use: Not on file  . Drug use: Not on file  . Sexual activity: Not on file

## 2020-02-12 ENCOUNTER — Encounter: Payer: Self-pay | Admitting: Orthopaedic Surgery

## 2020-02-12 ENCOUNTER — Encounter: Payer: Self-pay | Admitting: Radiology

## 2020-05-16 ENCOUNTER — Encounter: Payer: Self-pay | Admitting: Orthopaedic Surgery

## 2021-05-01 ENCOUNTER — Encounter: Payer: Self-pay | Admitting: Orthopaedic Surgery

## 2021-05-01 ENCOUNTER — Ambulatory Visit (INDEPENDENT_AMBULATORY_CARE_PROVIDER_SITE_OTHER): Payer: Medicare Other | Admitting: Orthopaedic Surgery

## 2021-05-01 DIAGNOSIS — M25562 Pain in left knee: Secondary | ICD-10-CM

## 2021-05-01 DIAGNOSIS — G8929 Other chronic pain: Secondary | ICD-10-CM

## 2021-05-01 NOTE — Progress Notes (Signed)
The patient comes in today with a chief complaint of left knee instability.  It does hurt on occasion especially with coming down hills.  She has never injured the left knee.  We did x-rayed in June of this year and it showed only some mild arthritic findings.  I have replaced her right hip.  She has had some pain around the right hip trochanteric area but has been dealing with right ankle issues as well.  She has never had surgery on the left knee and has not had any type of injections either.  Her left knee is ligaments are stable full range of motion no effusion.  Her right hip shows pain only over the trochanteric area of the IT band and the hip moves smoothly.  She does not walk with a limp and does not need an assist device to walk.  She is a very active 65 years old.  At this point I do feel that she would benefit from outpatient physical therapy with strengthening muscles around the left knee as well as any modalities that can decrease her right hip and IT band pain.  They can certainly work on her ankles as well.  She agrees with this treatment plan.  I gave her a generic prescription for outpatient therapy since she is from [facility].  She will seek physical therapy near where she lives.  I can then see her back in 6 weeks to see how she is responded to that.  I have also recommended an over-the-counter knee sleeve for her left knee.  All question concerns were answered and addressed.  No x-rays are needed in 6 weeks we will see her back.

## 2021-06-12 ENCOUNTER — Ambulatory Visit (INDEPENDENT_AMBULATORY_CARE_PROVIDER_SITE_OTHER): Payer: Medicare Other | Admitting: Orthopaedic Surgery

## 2021-06-12 ENCOUNTER — Encounter: Payer: Self-pay | Admitting: Orthopaedic Surgery

## 2021-06-12 DIAGNOSIS — G8929 Other chronic pain: Secondary | ICD-10-CM | POA: Diagnosis not present

## 2021-06-12 DIAGNOSIS — M25562 Pain in left knee: Secondary | ICD-10-CM | POA: Diagnosis not present

## 2021-06-12 NOTE — Progress Notes (Signed)
The patient is now 4 weeks into physical therapy on her left knee.  I have actually replaced her right hip in 2020 and she had been having right ankle issues but her left knee was really flaring up on her especially pain going up and down hills.  She says the physical therapy is really helped her quite a bit.  She is better overall but the steps are still difficult for her.  She is scheduled to have at least 4 more weeks of physical therapy to strengthen the muscles around the knee.  She is a very active 65 year old female.  Examination of the right knee does show show pain to the medial lateral joint lines when I rotate the tibia on the femur suggesting some chronic meniscal tearing.  There is no effusion and she has good range of motion of the knee and it is ligamentously stable.  I agree with her trying at least 4 more weeks of physical therapy to strengthen the knee and we can go from there in terms of whether or not she needs a MRI of the knee if she does not improve.  We will see her back in 4 weeks to see how she is doing overall but no x-rays needed.

## 2021-07-10 ENCOUNTER — Ambulatory Visit: Payer: Medicare Other | Admitting: Orthopaedic Surgery

## 2021-07-16 ENCOUNTER — Other Ambulatory Visit: Payer: Self-pay

## 2021-07-16 ENCOUNTER — Ambulatory Visit (INDEPENDENT_AMBULATORY_CARE_PROVIDER_SITE_OTHER): Payer: Medicare Other | Admitting: Orthopaedic Surgery

## 2021-07-16 ENCOUNTER — Encounter: Payer: Self-pay | Admitting: Orthopaedic Surgery

## 2021-07-16 DIAGNOSIS — G8929 Other chronic pain: Secondary | ICD-10-CM | POA: Diagnosis not present

## 2021-07-16 DIAGNOSIS — M25562 Pain in left knee: Secondary | ICD-10-CM | POA: Diagnosis not present

## 2021-07-16 NOTE — Progress Notes (Signed)
The patient has had multiple months now conservative treatment for her left knee.  Her plain films show well-maintained joint space which hurts quite a bit with pivoting activities with that knee.  We have provided steroid injection of the knee.  She has worked on activity modification.  She is taking anti-inflammatories.  She has had formal physical therapy for her left knee as well.  That is helped in terms of her mobility and strengthening but she still has pain with certain activities and pivoting with the left knee does catch on her quite a bit.  There is locking as well.  Examination of her left knee shows medial and lateral joint line tenderness.  She is very irritated at the exam with me pivoting the tibia on the femur both medial and lateral compartments.  At this point with the failure of all forms of conservative treatment for several months now including physical therapy, injections, anti-inflammatories, rest, activity modification and time, a MRI of the left knee is warranted to rule out meniscal tear.  She agrees with this treatment plan.  She will be in touch with [place] when she has an MRI date so we can see her 2 to 3 days after the MRI.

## 2021-07-23 ENCOUNTER — Encounter: Payer: Self-pay | Admitting: Orthopaedic Surgery

## 2021-08-07 ENCOUNTER — Ambulatory Visit
Admission: RE | Admit: 2021-08-07 | Discharge: 2021-08-07 | Disposition: A | Discharge status: Home / Self Care | Payer: Medicare Other | Source: Ambulatory Visit | Attending: Orthopaedic Surgery | Admitting: Orthopaedic Surgery

## 2021-08-07 ENCOUNTER — Other Ambulatory Visit: Payer: Self-pay

## 2021-08-07 DIAGNOSIS — G8929 Other chronic pain: Secondary | ICD-10-CM

## 2021-08-13 ENCOUNTER — Encounter: Payer: Self-pay | Admitting: Orthopaedic Surgery

## 2021-08-13 ENCOUNTER — Ambulatory Visit (INDEPENDENT_AMBULATORY_CARE_PROVIDER_SITE_OTHER): Payer: Medicare Other | Admitting: Orthopaedic Surgery

## 2021-08-13 ENCOUNTER — Other Ambulatory Visit: Payer: Self-pay

## 2021-08-13 DIAGNOSIS — G8929 Other chronic pain: Secondary | ICD-10-CM | POA: Diagnosis not present

## 2021-08-13 DIAGNOSIS — M25562 Pain in left knee: Secondary | ICD-10-CM | POA: Diagnosis not present

## 2021-08-13 NOTE — Progress Notes (Signed)
HPI: Ms. Onofrio returns today for the MRI of her left knee.  She continues to have left knee pain but states it is slightly improved.  She is performing the exercises shown by therapy.  She has had no new injury. MRI left knee dated 08/07/2018 shows no meniscal tear.  Does show evidence of patella maltracking with a TT-TG distance measuring 20 mm.  Edema is noted of the superior medial aspect of Hoffa's pad with chondromalacia of the patella but there is mild partial-thickness cartilage loss to high-grade loss mid to inferior lateral patella facet..  Otherwise mild arthritic changes in the knee.   Physical exam: General well-developed well-nourished female in no acute distress. Left knee full extension full flexion.  No abnormal warmth erythema.  Significant patellofemoral crepitus which is both palpable and audible.  Atrophy of the VMO..  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  Also discussed with her using an open patella knee brace with lateral abutment.  Discussed knee friendly exercises with her.  She may benefit from repeat steroid injection and possible supplemental injection in future.  Questions were encouraged and answered at length.  Follow-up with [place] in 6 months to see how she is doing overall

## 2023-05-21 IMAGING — MR MR KNEE*L* W/O CM
4 of 7 series · 21 of 40 positions shown · non-contrast
Comparison: X-ray knee 01/01/2020.

CLINICAL DATA: Meniscal injury, knee. Left knee pain for 1 year.

EXAM:
MRI OF THE LEFT KNEE WITHOUT CONTRAST
TECHNIQUE: Multiplanar, multisequence MR imaging of the knee was performed. No
intravenous contrast was administered.

[Series 3: T2 fat-sat · axial · 4.0mm · 0.50mm/px · z∈[-48,+67]mm · 5 of 24 slices shown]
[im 1/24]
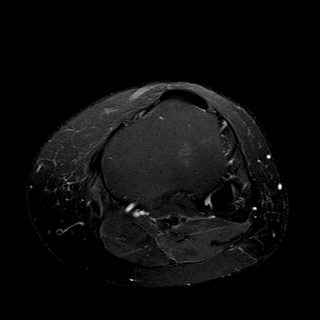
[im 6/24]
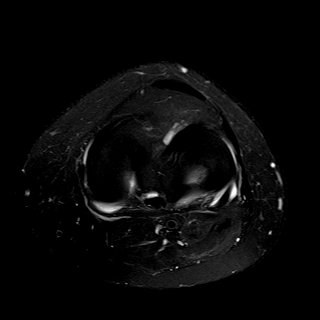
[im 12/24]
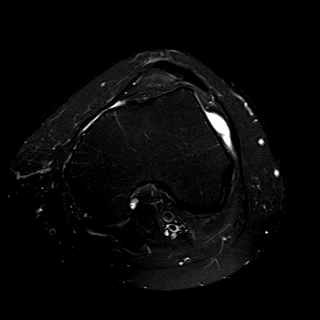
[im 18/24]
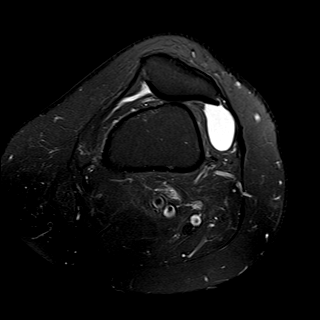
[im 24/24]
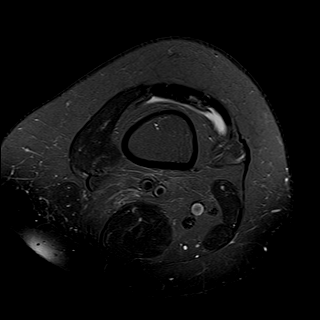

[Series 7: PD fat-sat · sagittal · 3.0mm · 0.29mm/px · 7 of 27 slices shown (1 of 3)]
[im 1/27]
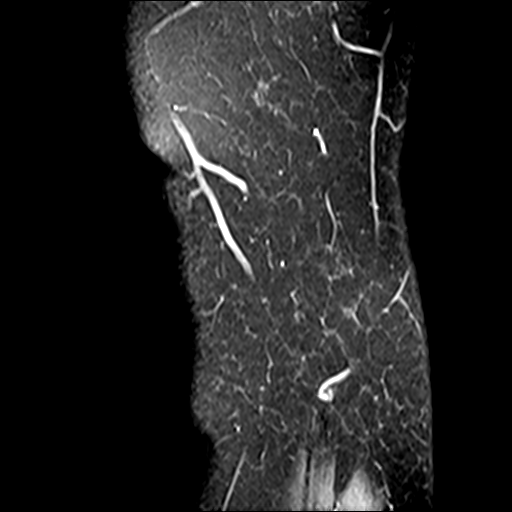
[im 5/27]
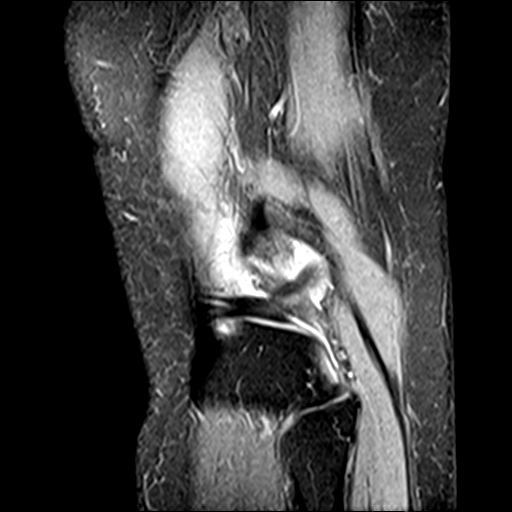
[im 9/27]
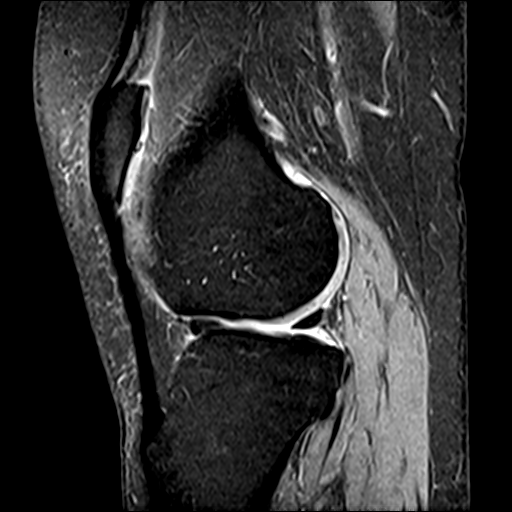
[im 14/27]
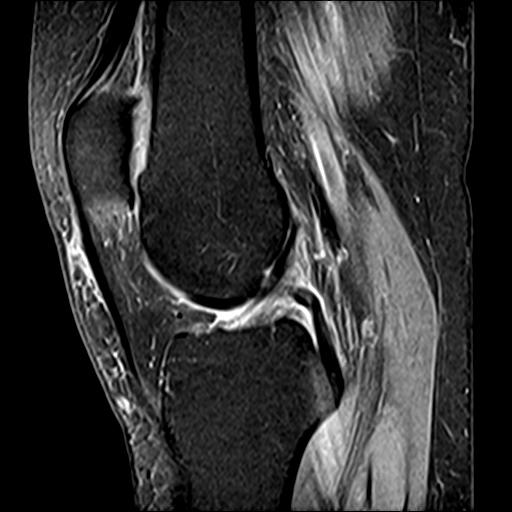
[im 18/27]
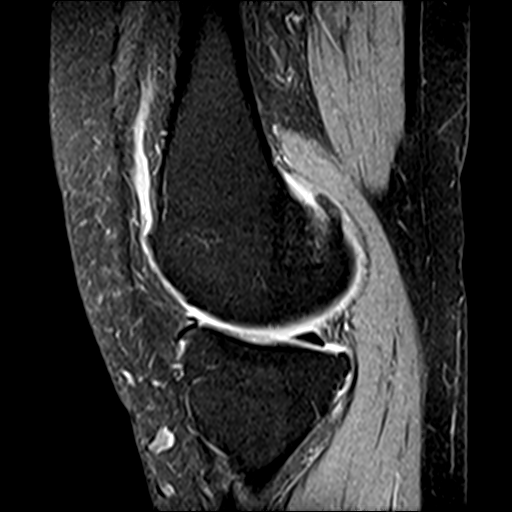
[im 22/27]
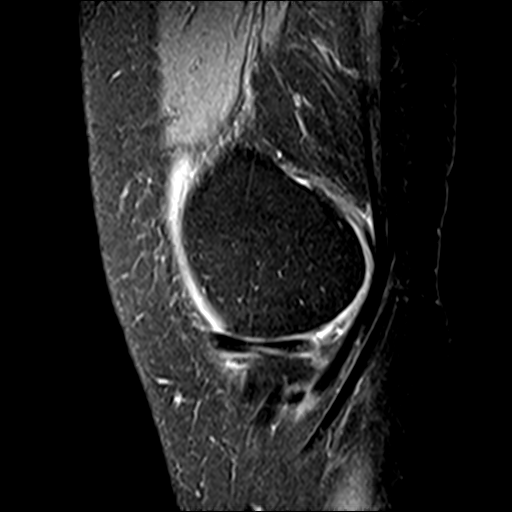
[im 27/27]
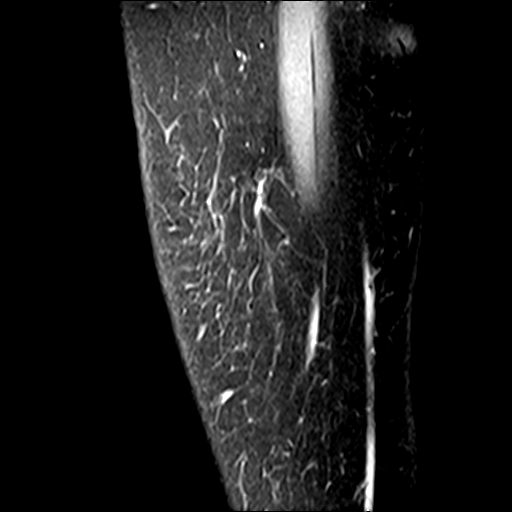

[Series 8: PD fat-sat · coronal · 4.0mm · 0.29mm/px · 6 of 22 slices shown (2 of 3)]
[im 1/22]
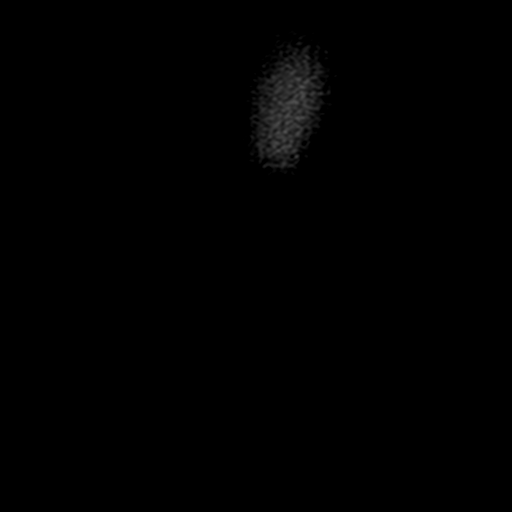
[im 5/22]
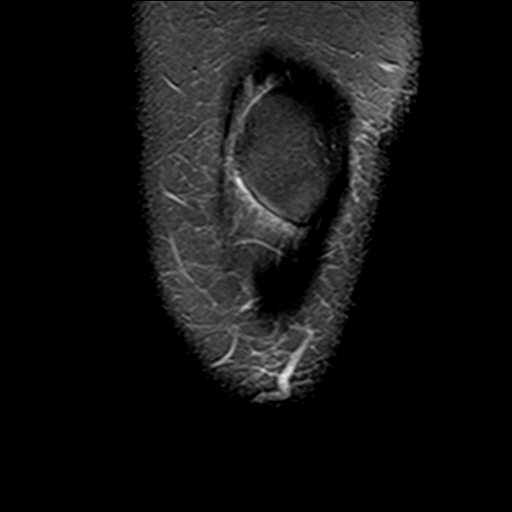
[im 9/22]
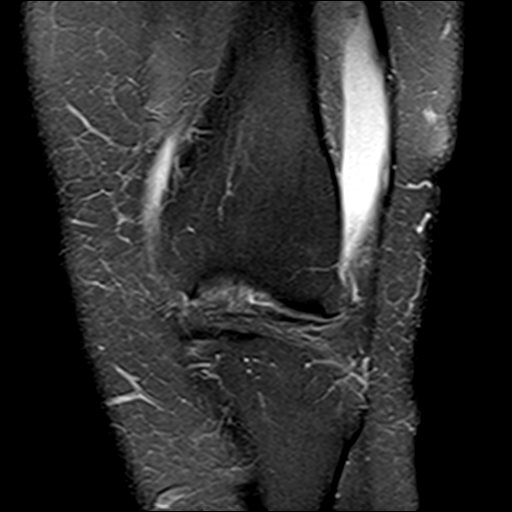
[im 13/22]
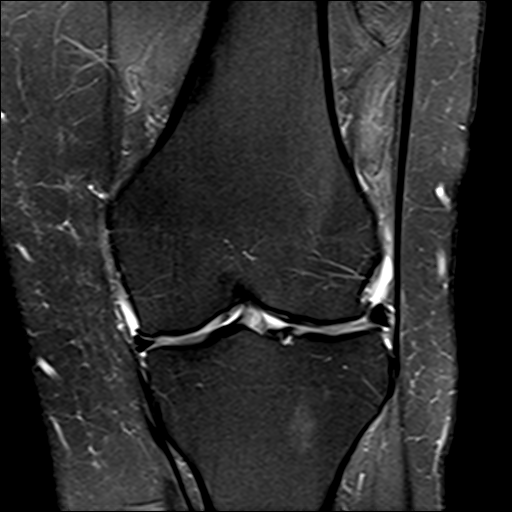
[im 17/22]
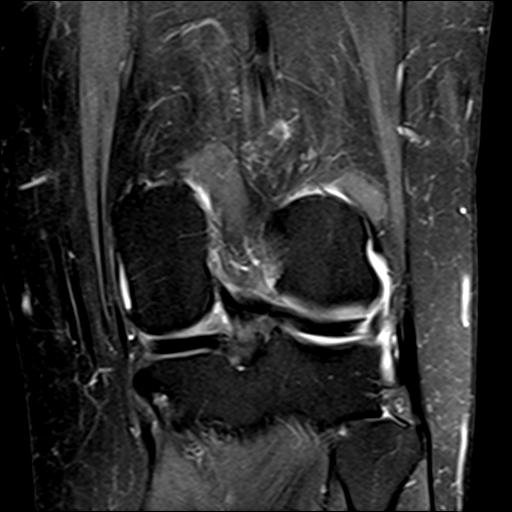
[im 22/22]
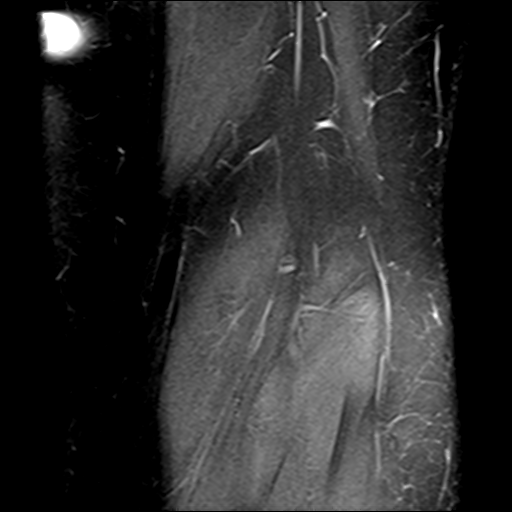

[Series 9: PD fat-sat · oblique · 2.0mm · 0.29mm/px · 3 of 11 slices shown (3 of 3)]
[im 1/11]
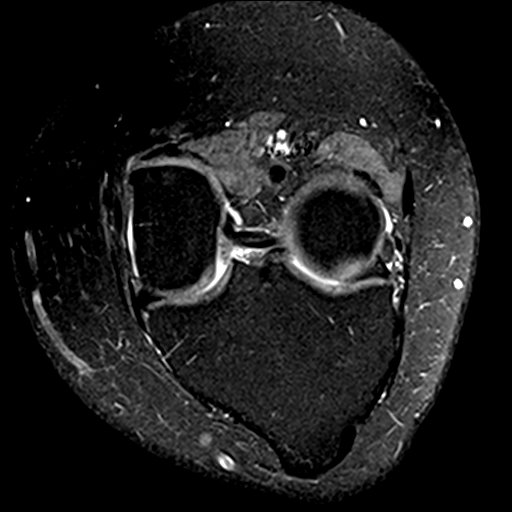
[im 6/11]
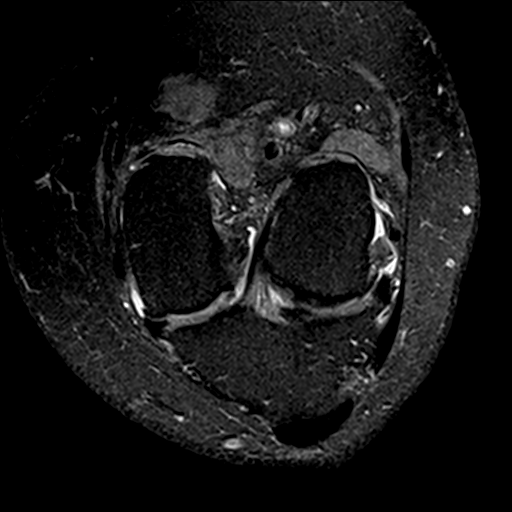
[im 11/11]
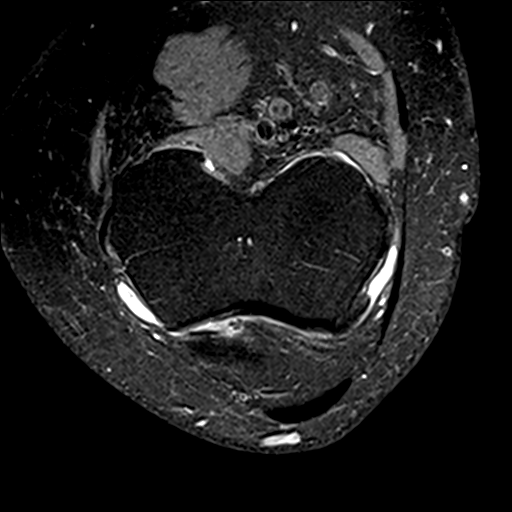

[21 of 40 positions shown; findings below may reference images not displayed]

FINDINGS: MENISCI

Medial: Intact.

Lateral: Free edge fraying and blunting of the lateral meniscal
body. Undersurface fraying of the posterior root of the lateral
meniscus. No displaced tear.

LIGAMENTS

Cruciates: ACL and PCL are intact.

Collaterals: Medial collateral ligament is intact. Lateral
collateral ligament complex is intact.

CARTILAGE

Patellofemoral:  Mild to moderate trochlear chondrosis.

Medial:  Mild partial-thickness cartilage loss.

Lateral: Areas of mild partial-thickness cartilage loss. High-grade
cartilage loss along the mid to inferior lateral patellar facet.

JOINT: Small joint effusion. There is edema within the superomedial
Hoffa's fat pad with mild adjacent reactive edema in the patella.

POPLITEAL FOSSA: No Baker's cyst.

EXTENSOR MECHANISM: Intact quadriceps and patellar tendon. Mild
patella Alta. Slight lateral patellar positioning and tilt. There is
medial-lateral trochlear facet asymmetry. TT-TG distance measures
2.0 cm. There is no bony contusion in the lateral femoral condyle to
suggest any recent patellar dislocation event. The trochlear groove
appears shallow. The MPFL/medial retinaculum appears intact.

BONES: No aggressive osseous lesion. No fracture or dislocation.
Tricompartment osteophyte formation.

Other: No fluid collection or hematoma. Muscles are normal.
IMPRESSION: 1. Free edge fraying and blunting of the lateral meniscal body and
undersurface fraying at the posterior root of the lateral meniscus.
No definitive meniscus tear.
2. Tricompartment osteoarthritis, cartilaginous abnormalities as
described above.
3. Evidence of patellar maltracking as discussed above, with likely
friction related edema in the superomedial Hoffa's fat pad and
adjacent edema in the patella.
4. Small joint effusion.
